# Patient Record
Sex: Male | Born: 1985 | Race: White | Hispanic: No | Marital: Single | Smoking: Current every day smoker
Health system: Southern US, Community
[De-identification: ages and names within clinical notes are randomized; demographics above are authoritative.]

## PROBLEM LIST (undated history)

## (undated) DIAGNOSIS — X838XXA Intentional self-harm by other specified means, initial encounter: Secondary | ICD-10-CM

## (undated) DIAGNOSIS — S069XAA Unspecified intracranial injury with loss of consciousness status unknown, initial encounter: Secondary | ICD-10-CM

## (undated) DIAGNOSIS — F329 Major depressive disorder, single episode, unspecified: Secondary | ICD-10-CM

## (undated) DIAGNOSIS — F4481 Dissociative identity disorder: Secondary | ICD-10-CM

## (undated) DIAGNOSIS — I10 Essential (primary) hypertension: Secondary | ICD-10-CM

## (undated) DIAGNOSIS — F419 Anxiety disorder, unspecified: Secondary | ICD-10-CM

## (undated) DIAGNOSIS — G629 Polyneuropathy, unspecified: Secondary | ICD-10-CM

## (undated) DIAGNOSIS — E119 Type 2 diabetes mellitus without complications: Secondary | ICD-10-CM

## (undated) DIAGNOSIS — S069X9A Unspecified intracranial injury with loss of consciousness of unspecified duration, initial encounter: Secondary | ICD-10-CM

## (undated) DIAGNOSIS — F32A Depression, unspecified: Secondary | ICD-10-CM

## (undated) HISTORY — PX: NO PAST SURGERIES: SHX2092

---
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
## 2009-06-18 ENCOUNTER — Emergency Department (HOSPITAL_COMMUNITY): Admission: EM | Admit: 2009-06-18 | Discharge: 2009-06-18 | Payer: Self-pay | Admitting: Emergency Medicine

## 2012-12-14 DIAGNOSIS — F4481 Dissociative identity disorder: Secondary | ICD-10-CM

## 2012-12-14 HISTORY — DX: Dissociative identity disorder: F44.81

## 2013-11-04 ENCOUNTER — Emergency Department (HOSPITAL_COMMUNITY)
Admission: EM | Admit: 2013-11-04 | Discharge: 2013-11-04 | Disposition: A | Discharge status: Home / Self Care | Payer: Self-pay | Attending: Emergency Medicine | Admitting: Emergency Medicine

## 2013-11-04 ENCOUNTER — Encounter (HOSPITAL_COMMUNITY): Payer: Self-pay | Admitting: Emergency Medicine

## 2013-11-04 ENCOUNTER — Emergency Department (HOSPITAL_COMMUNITY): Payer: Self-pay

## 2013-11-04 DIAGNOSIS — Z8659 Personal history of other mental and behavioral disorders: Secondary | ICD-10-CM | POA: Insufficient documentation

## 2013-11-04 DIAGNOSIS — S46909A Unspecified injury of unspecified muscle, fascia and tendon at shoulder and upper arm level, unspecified arm, initial encounter: Secondary | ICD-10-CM | POA: Insufficient documentation

## 2013-11-04 DIAGNOSIS — M25512 Pain in left shoulder: Secondary | ICD-10-CM

## 2013-11-04 DIAGNOSIS — S4980XA Other specified injuries of shoulder and upper arm, unspecified arm, initial encounter: Secondary | ICD-10-CM | POA: Insufficient documentation

## 2013-11-04 DIAGNOSIS — M791 Myalgia, unspecified site: Secondary | ICD-10-CM

## 2013-11-04 DIAGNOSIS — IMO0002 Reserved for concepts with insufficient information to code with codable children: Secondary | ICD-10-CM | POA: Insufficient documentation

## 2013-11-04 DIAGNOSIS — F17200 Nicotine dependence, unspecified, uncomplicated: Secondary | ICD-10-CM | POA: Insufficient documentation

## 2013-11-04 DIAGNOSIS — S0993XA Unspecified injury of face, initial encounter: Secondary | ICD-10-CM | POA: Insufficient documentation

## 2013-11-04 DIAGNOSIS — M542 Cervicalgia: Secondary | ICD-10-CM

## 2013-11-04 HISTORY — DX: Depression, unspecified: F32.A

## 2013-11-04 HISTORY — DX: Intentional self-harm by other specified means, initial encounter: X83.8XXA

## 2013-11-04 HISTORY — DX: Major depressive disorder, single episode, unspecified: F32.9

## 2013-11-04 MED ORDER — METHOCARBAMOL 500 MG PO TABS
500.0000 mg | ORAL_TABLET | Freq: Once | ORAL | Status: AC
  Administered 2013-11-04: 500 mg via ORAL
  Filled 2013-11-04: qty 1

## 2013-11-04 MED ORDER — CYCLOBENZAPRINE HCL 10 MG PO TABS: 10.0000 mg | ORAL_TABLET | Freq: Two times a day (BID) | ORAL | Status: DC | PRN
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
## 2013-11-04 NOTE — ED Notes (Signed)
States he is left handed. States has been  Taking ibuprofen for pain without relief. States cannot tolerate the pain anymore

## 2013-11-07 NOTE — ED Provider Notes (Signed)
Medical screening examination/treatment/procedure(s) were performed by non-physician practitioner and as supervising physician I was immediately available for consultation/collaboration.  EKG Interpretation   None        Donnetta Hutching, MD 11/07/13 [phone]

## 2014-03-07 ENCOUNTER — Emergency Department (HOSPITAL_COMMUNITY): Payer: Self-pay

## 2014-03-07 ENCOUNTER — Inpatient Hospital Stay (HOSPITAL_COMMUNITY)
Admission: EM | Admit: 2014-03-07 | Discharge: 2014-03-13 | DRG: 964 | Disposition: A | Discharge status: Home Health | Payer: Self-pay | Attending: Surgery | Admitting: Surgery

## 2014-03-07 DIAGNOSIS — S06339A Contusion and laceration of cerebrum, unspecified, with loss of consciousness of unspecified duration, initial encounter: Secondary | ICD-10-CM

## 2014-03-07 DIAGNOSIS — S32009A Unspecified fracture of unspecified lumbar vertebra, initial encounter for closed fracture: Secondary | ICD-10-CM | POA: Diagnosis present

## 2014-03-07 DIAGNOSIS — S0180XA Unspecified open wound of other part of head, initial encounter: Secondary | ICD-10-CM | POA: Diagnosis present

## 2014-03-07 DIAGNOSIS — M25469 Effusion, unspecified knee: Secondary | ICD-10-CM | POA: Diagnosis present

## 2014-03-07 DIAGNOSIS — IMO0002 Reserved for concepts with insufficient information to code with codable children: Secondary | ICD-10-CM

## 2014-03-07 DIAGNOSIS — S06300A Unspecified focal traumatic brain injury without loss of consciousness, initial encounter: Principal | ICD-10-CM

## 2014-03-07 DIAGNOSIS — S02401A Maxillary fracture, unspecified, initial encounter for closed fracture: Secondary | ICD-10-CM | POA: Diagnosis present

## 2014-03-07 DIAGNOSIS — S36899A Unspecified injury of other intra-abdominal organs, initial encounter: Secondary | ICD-10-CM

## 2014-03-07 DIAGNOSIS — S069XAA Unspecified intracranial injury with loss of consciousness status unknown, initial encounter: Secondary | ICD-10-CM

## 2014-03-07 DIAGNOSIS — S02400A Malar fracture unspecified, initial encounter for closed fracture: Secondary | ICD-10-CM | POA: Diagnosis present

## 2014-03-07 DIAGNOSIS — H532 Diplopia: Secondary | ICD-10-CM | POA: Diagnosis present

## 2014-03-07 DIAGNOSIS — R404 Transient alteration of awareness: Secondary | ICD-10-CM | POA: Diagnosis present

## 2014-03-07 DIAGNOSIS — D62 Acute posthemorrhagic anemia: Secondary | ICD-10-CM | POA: Diagnosis not present

## 2014-03-07 DIAGNOSIS — S0240CA Maxillary fracture, right side, initial encounter for closed fracture: Secondary | ICD-10-CM

## 2014-03-07 DIAGNOSIS — S0083XA Contusion of other part of head, initial encounter: Secondary | ICD-10-CM

## 2014-03-07 DIAGNOSIS — S0636AA Traumatic hemorrhage of cerebrum, unspecified, with loss of consciousness status unknown, initial encounter: Secondary | ICD-10-CM | POA: Diagnosis present

## 2014-03-07 DIAGNOSIS — S0181XA Laceration without foreign body of other part of head, initial encounter: Secondary | ICD-10-CM | POA: Diagnosis present

## 2014-03-07 DIAGNOSIS — T07XXXA Unspecified multiple injuries, initial encounter: Secondary | ICD-10-CM | POA: Diagnosis present

## 2014-03-07 DIAGNOSIS — S0292XA Unspecified fracture of facial bones, initial encounter for closed fracture: Secondary | ICD-10-CM | POA: Diagnosis present

## 2014-03-07 DIAGNOSIS — S0633AA Contusion and laceration of cerebrum, unspecified, with loss of consciousness status unknown, initial encounter: Secondary | ICD-10-CM

## 2014-03-07 DIAGNOSIS — S020XXA Fracture of vault of skull, initial encounter for closed fracture: Principal | ICD-10-CM | POA: Diagnosis present

## 2014-03-07 DIAGNOSIS — S069X9A Unspecified intracranial injury with loss of consciousness of unspecified duration, initial encounter: Secondary | ICD-10-CM

## 2014-03-07 DIAGNOSIS — K661 Hemoperitoneum: Secondary | ICD-10-CM | POA: Diagnosis present

## 2014-03-07 DIAGNOSIS — F17200 Nicotine dependence, unspecified, uncomplicated: Secondary | ICD-10-CM | POA: Diagnosis present

## 2014-03-07 DIAGNOSIS — S06369A Traumatic hemorrhage of cerebrum, unspecified, with loss of consciousness of unspecified duration, initial encounter: Secondary | ICD-10-CM | POA: Diagnosis present

## 2014-03-07 DIAGNOSIS — S27329A Contusion of lung, unspecified, initial encounter: Secondary | ICD-10-CM | POA: Diagnosis present

## 2014-03-07 DIAGNOSIS — H0520 Unspecified exophthalmos: Secondary | ICD-10-CM | POA: Diagnosis present

## 2014-03-07 DIAGNOSIS — S1093XA Contusion of unspecified part of neck, initial encounter: Secondary | ICD-10-CM

## 2014-03-07 DIAGNOSIS — S0003XA Contusion of scalp, initial encounter: Secondary | ICD-10-CM | POA: Diagnosis present

## 2014-03-07 DIAGNOSIS — K683 Retroperitoneal hematoma: Secondary | ICD-10-CM | POA: Diagnosis present

## 2014-03-07 DIAGNOSIS — S0291XA Unspecified fracture of skull, initial encounter for closed fracture: Secondary | ICD-10-CM

## 2014-03-07 HISTORY — DX: Polyneuropathy, unspecified: G62.9

## 2014-03-07 HISTORY — DX: Anxiety disorder, unspecified: F41.9

## 2014-03-07 HISTORY — DX: Dissociative identity disorder: F44.81

## 2014-03-07 LAB — ABO/RH: ABO/RH(D): A POS

## 2014-03-07 LAB — I-STAT CHEM 8, ED
BUN: 14 mg/dL (ref 6–23)
Calcium, Ion: 1.09 mmol/L — ABNORMAL LOW (ref 1.12–1.23)
Chloride: 105 mEq/L (ref 96–112)
Creatinine, Ser: 1.2 mg/dL (ref 0.50–1.35)
Glucose, Bld: 165 mg/dL — ABNORMAL HIGH (ref 70–99)
HCT: 44 % (ref 39.0–52.0)
Hemoglobin: 15 g/dL (ref 13.0–17.0)
Potassium: 3.6 mEq/L — ABNORMAL LOW (ref 3.7–5.3)
Sodium: 141 mEq/L (ref 137–147)
TCO2: 21 mmol/L (ref 0–100)

## 2014-03-07 LAB — COMPREHENSIVE METABOLIC PANEL
ALK PHOS: 59 U/L (ref 39–117)
ALT: 131 U/L — AB (ref 0–53)
AST: 452 U/L — ABNORMAL HIGH (ref 0–37)
Albumin: 3.2 g/dL — ABNORMAL LOW (ref 3.5–5.2)
BUN: 14 mg/dL (ref 6–23)
CHLORIDE: 102 meq/L (ref 96–112)
CO2: 20 mEq/L (ref 19–32)
Calcium: 8.4 mg/dL (ref 8.4–10.5)
Creatinine, Ser: 0.8 mg/dL (ref 0.50–1.35)
GFR calc Af Amer: >90 mL/min (ref >90)
GFR calc non Af Amer: >90 mL/min (ref >90)
GLUCOSE: 170 mg/dL — AB (ref 70–99)
POTASSIUM: 4 meq/L (ref 3.7–5.3)
SODIUM: 140 meq/L (ref 137–147)
TOTAL PROTEIN: 6.5 g/dL (ref 6.0–8.3)
Total Bilirubin: <0.2 mg/dL — ABNORMAL LOW (ref 0.3–1.2)

## 2014-03-07 LAB — PREPARE FRESH FROZEN PLASMA
UNIT DIVISION: 0
Unit division: 0

## 2014-03-07 LAB — ETHANOL: Alcohol, Ethyl (B): 237 mg/dL — ABNORMAL HIGH (ref 0–11)

## 2014-03-07 LAB — TYPE AND SCREEN
ABO/RH(D): A POS
Antibody Screen: NEGATIVE
Unit division: 0
Unit division: 0

## 2014-03-07 LAB — CBC
HCT: 39.4 % (ref 39.0–52.0)
Hemoglobin: 14.4 g/dL (ref 13.0–17.0)
MCH: 34.6 pg — ABNORMAL HIGH (ref 26.0–34.0)
MCHC: 36.5 g/dL — ABNORMAL HIGH (ref 30.0–36.0)
MCV: 94.7 fL (ref 78.0–100.0)
Platelets: 197 10*3/uL (ref 150–400)
RBC: 4.16 MIL/uL — ABNORMAL LOW (ref 4.22–5.81)
RDW: 12.8 % (ref 11.5–15.5)
WBC: 11.7 10*3/uL — ABNORMAL HIGH (ref 4.0–10.5)

## 2014-03-07 LAB — PROTIME-INR
INR: 1.03 (ref 0.00–1.49)
Prothrombin Time: 13.3 seconds (ref 11.6–15.2)

## 2014-03-07 LAB — LACTIC ACID, PLASMA: Lactic Acid, Venous: 3.8 mmol/L — ABNORMAL HIGH (ref 0.5–2.2)

## 2014-03-07 LAB — CDS SEROLOGY

## 2014-03-07 LAB — I-STAT CG4 LACTIC ACID, ED: LACTIC ACID, VENOUS: 3.55 mmol/L — AB (ref 0.5–2.2)

## 2014-03-07 MED ORDER — HYDROMORPHONE HCL PF 1 MG/ML IJ SOLN
1.0000 mg | Freq: Once | INTRAMUSCULAR | Status: AC
  Administered 2014-03-07: 1 mg via INTRAVENOUS
  Filled 2014-03-07: qty 1

## 2014-03-07 MED ORDER — FENTANYL CITRATE 0.05 MG/ML IJ SOLN
INTRAMUSCULAR | Status: AC
  Filled 2014-03-07: qty 2

## 2014-03-07 MED ORDER — FENTANYL CITRATE 0.05 MG/ML IJ SOLN
50.0000 ug | Freq: Once | INTRAMUSCULAR | Status: AC
  Administered 2014-03-07: 50 ug via INTRAVENOUS

## 2014-03-07 MED ORDER — SODIUM CHLORIDE 0.9 % IV BOLUS (SEPSIS)
1000.0000 mL | Freq: Once | INTRAVENOUS | Status: AC
  Administered 2014-03-07: 1000 mL via INTRAVENOUS

## 2014-03-07 MED ORDER — TETANUS-DIPHTH-ACELL PERTUSSIS 5-2.5-18.5 LF-MCG/0.5 IM SUSP
0.5000 mL | Freq: Once | INTRAMUSCULAR | Status: AC
  Administered 2014-03-07: 0.5 mL via INTRAMUSCULAR
  Filled 2014-03-07: qty 0.5

## 2014-03-07 MED ORDER — IOHEXOL 300 MG/ML  SOLN
100.0000 mL | Freq: Once | INTRAMUSCULAR | Status: AC | PRN
  Administered 2014-03-07: 100 mL via INTRAVENOUS
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
## 2014-03-07 NOTE — ED Notes (Signed)
See trauma documentation for triage.
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
## 2014-03-08 ENCOUNTER — Inpatient Hospital Stay (HOSPITAL_COMMUNITY): Payer: Self-pay

## 2014-03-08 ENCOUNTER — Encounter (HOSPITAL_COMMUNITY): Payer: Self-pay | Admitting: Emergency Medicine

## 2014-03-08 DIAGNOSIS — S069X9A Unspecified intracranial injury with loss of consciousness of unspecified duration, initial encounter: Secondary | ICD-10-CM

## 2014-03-08 DIAGNOSIS — S069XAA Unspecified intracranial injury with loss of consciousness status unknown, initial encounter: Secondary | ICD-10-CM

## 2014-03-08 LAB — CBC
HCT: 36.1 % — ABNORMAL LOW (ref 39.0–52.0)
Hemoglobin: 12.9 g/dL — ABNORMAL LOW (ref 13.0–17.0)
MCH: 33.9 pg (ref 26.0–34.0)
MCHC: 35.7 g/dL (ref 30.0–36.0)
MCV: 94.8 fL (ref 78.0–100.0)
Platelets: 169 10*3/uL (ref 150–400)
RBC: 3.81 MIL/uL — ABNORMAL LOW (ref 4.22–5.81)
RDW: 12.9 % (ref 11.5–15.5)
WBC: 13.4 10*3/uL — ABNORMAL HIGH (ref 4.0–10.5)

## 2014-03-08 LAB — BASIC METABOLIC PANEL
BUN: 8 mg/dL (ref 6–23)
CO2: 19 mEq/L (ref 19–32)
Calcium: 8.1 mg/dL — ABNORMAL LOW (ref 8.4–10.5)
Chloride: 105 mEq/L (ref 96–112)
Creatinine, Ser: 0.58 mg/dL (ref 0.50–1.35)
GFR calc Af Amer: >90 mL/min (ref >90)
Glucose, Bld: 169 mg/dL — ABNORMAL HIGH (ref 70–99)
POTASSIUM: 3.8 meq/L (ref 3.7–5.3)
SODIUM: 140 meq/L (ref 137–147)

## 2014-03-08 MED ORDER — OXYCODONE-ACETAMINOPHEN 5-325 MG PO TABS
1.0000 | ORAL_TABLET | ORAL | Status: DC | PRN
  Administered 2014-03-08: 1 via ORAL
  Administered 2014-03-08 (×2): 2 via ORAL
  Administered 2014-03-08: 1 via ORAL
  Administered 2014-03-09 – 2014-03-11 (×10): 2 via ORAL
  Filled 2014-03-08 (×6): qty 2
  Filled 2014-03-08: qty 1
  Filled 2014-03-08: qty 2
  Filled 2014-03-08: qty 1
  Filled 2014-03-08 (×5): qty 2

## 2014-03-08 MED ORDER — LORAZEPAM 2 MG/ML IJ SOLN
1.0000 mg | Freq: Once | INTRAMUSCULAR | Status: AC
  Administered 2014-03-08: 1 mg via INTRAVENOUS
  Filled 2014-03-08: qty 1

## 2014-03-08 MED ORDER — HYDROMORPHONE HCL PF 1 MG/ML IJ SOLN
1.0000 mg | INTRAMUSCULAR | Status: DC | PRN
  Administered 2014-03-08 – 2014-03-09 (×8): 1 mg via INTRAVENOUS
  Filled 2014-03-08 (×8): qty 1

## 2014-03-08 MED ORDER — ONDANSETRON HCL 4 MG/2ML IJ SOLN
4.0000 mg | Freq: Four times a day (QID) | INTRAMUSCULAR | Status: DC | PRN
  Administered 2014-03-08 – 2014-03-09 (×2): 4 mg via INTRAVENOUS
  Filled 2014-03-08 (×2): qty 2

## 2014-03-08 MED ORDER — ACETAMINOPHEN 325 MG PO TABS
650.0000 mg | ORAL_TABLET | ORAL | Status: DC | PRN
  Administered 2014-03-11 (×2): 325 mg via ORAL
  Administered 2014-03-12: 650 mg via ORAL
  Filled 2014-03-08 (×3): qty 2

## 2014-03-08 MED ORDER — ONDANSETRON HCL 4 MG PO TABS: 4.0000 mg | ORAL_TABLET | Freq: Four times a day (QID) | ORAL | Status: DC | PRN

## 2014-03-08 MED ORDER — BACITRACIN ZINC 500 UNIT/GM EX OINT
TOPICAL_OINTMENT | Freq: Every day | CUTANEOUS | Status: DC
  Administered 2014-03-08 – 2014-03-09 (×2): via TOPICAL
  Filled 2014-03-08: qty 28.35

## 2014-03-08 MED ORDER — CEPHALEXIN 500 MG PO CAPS
500.0000 mg | ORAL_CAPSULE | Freq: Four times a day (QID) | ORAL | Status: DC
  Filled 2014-03-08: qty 1

## 2014-03-08 MED ORDER — SODIUM CHLORIDE 0.9 % IV SOLN
INTRAVENOUS | Status: DC
  Administered 2014-03-08 – 2014-03-10 (×3): via INTRAVENOUS

## 2014-03-08 MED ORDER — CEFAZOLIN SODIUM 1-5 GM-% IV SOLN
1.0000 g | Freq: Three times a day (TID) | INTRAVENOUS | Status: DC
  Administered 2014-03-08 – 2014-03-11 (×10): 1 g via INTRAVENOUS
  Filled 2014-03-08 (×14): qty 50

## 2014-03-08 NOTE — Progress Notes (Signed)
UR completed.  Hazeline Charnley, RN BSN [facility] CCM Trauma/Neuro ICU Case Manager [phone]  
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
## 2014-03-08 NOTE — Consult Note (Signed)
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 contusions. Skull fractures need no operative intervention. Will follow. Remains in cervical collar. No abnormalities on ct cervical spine.   Randall Burnett 03/08/2014, 6:10 PM
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
## 2014-03-09 ENCOUNTER — Encounter (HOSPITAL_COMMUNITY): Payer: Self-pay | Admitting: Physical Medicine and Rehabilitation

## 2014-03-09 ENCOUNTER — Inpatient Hospital Stay (HOSPITAL_COMMUNITY): Payer: Self-pay

## 2014-03-09 DIAGNOSIS — K683 Retroperitoneal hematoma: Secondary | ICD-10-CM | POA: Diagnosis present

## 2014-03-09 DIAGNOSIS — S0292XA Unspecified fracture of facial bones, initial encounter for closed fracture: Secondary | ICD-10-CM | POA: Diagnosis present

## 2014-03-09 DIAGNOSIS — S06369A Traumatic hemorrhage of cerebrum, unspecified, with loss of consciousness of unspecified duration, initial encounter: Secondary | ICD-10-CM | POA: Diagnosis present

## 2014-03-09 DIAGNOSIS — S0181XA Laceration without foreign body of other part of head, initial encounter: Secondary | ICD-10-CM | POA: Diagnosis present

## 2014-03-09 DIAGNOSIS — S0636AA Traumatic hemorrhage of cerebrum, unspecified, with loss of consciousness status unknown, initial encounter: Secondary | ICD-10-CM | POA: Diagnosis present

## 2014-03-09 DIAGNOSIS — S069XAA Unspecified intracranial injury with loss of consciousness status unknown, initial encounter: Secondary | ICD-10-CM

## 2014-03-09 DIAGNOSIS — S069X9A Unspecified intracranial injury with loss of consciousness of unspecified duration, initial encounter: Secondary | ICD-10-CM

## 2014-03-09 DIAGNOSIS — T07XXXA Unspecified multiple injuries, initial encounter: Secondary | ICD-10-CM | POA: Diagnosis present

## 2014-03-09 DIAGNOSIS — D62 Acute posthemorrhagic anemia: Secondary | ICD-10-CM | POA: Diagnosis not present

## 2014-03-09 DIAGNOSIS — K661 Hemoperitoneum: Secondary | ICD-10-CM | POA: Diagnosis present

## 2014-03-09 MED ORDER — BACITRACIN ZINC 500 UNIT/GM EX OINT
TOPICAL_OINTMENT | Freq: Two times a day (BID) | CUTANEOUS | Status: DC
  Administered 2014-03-09 – 2014-03-10 (×2): via TOPICAL
  Administered 2014-03-11: 1 via TOPICAL
  Administered 2014-03-11: 10:00:00 via TOPICAL
  Administered 2014-03-12: 1 via TOPICAL
  Administered 2014-03-12: 15.5556 via TOPICAL
  Administered 2014-03-13: 08:00:00 via TOPICAL
  Filled 2014-03-09: qty 28.35

## 2014-03-09 MED ORDER — QUETIAPINE FUMARATE 50 MG PO TABS
150.0000 mg | ORAL_TABLET | Freq: Every day | ORAL | Status: DC
  Administered 2014-03-09 – 2014-03-12 (×4): 150 mg via ORAL
  Filled 2014-03-09 (×5): qty 1

## 2014-03-09 MED ORDER — LORAZEPAM 2 MG/ML IJ SOLN
1.0000 mg | INTRAMUSCULAR | Status: DC | PRN
  Administered 2014-03-09: 1 mg via INTRAVENOUS
  Filled 2014-03-09: qty 1

## 2014-03-09 MED ORDER — BACITRACIN 500 UNIT/GM EX OINT
1.0000 "application " | TOPICAL_OINTMENT | Freq: Two times a day (BID) | CUTANEOUS | Status: DC
  Filled 2014-03-09: qty 0.9

## 2014-03-09 MED ORDER — BACITRACIN 500 UNIT/GM EX OINT
1.0000 "application " | TOPICAL_OINTMENT | Freq: Two times a day (BID) | CUTANEOUS | Status: DC
  Administered 2014-03-09: 1 via TOPICAL
  Filled 2014-03-09 (×2): qty 0.9
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
## 2014-03-09 NOTE — Progress Notes (Signed)
03/09/2014 11:29 AM  Wardell Heathountryman, [place] [id number]  Post-Op Day 2    Temp:  [98 F (36.7 C)-99.3 F (37.4 C)] 99.3 F (37.4 C) (03/27 0800) Pulse Rate:  [97-120] 98 (03/27 1000) Resp:  [15-25] 17 (03/27 1000) BP: (119-158)/(72-108) 138/72 mmHg (03/27 0800) SpO2:  [95 %-99 %] 96 % (03/27 1000),     Intake/Output Summary (Last 24 hours) at 03/09/14 1129 Last data filed at 03/09/14 1100  Gross per 24 hour  Intake   1350 ml  Output    725 ml  Net    625 ml    No results found for this or any previous visit (from the past 24 hour(s)).  SUBJECTIVE:  multiple areas of pain  OBJECTIVE:  Facial wounds intact.    IMPRESSION:  Satisfactory check  PLAN:   Wound hygiene.  Recheck my office 8-10 days, [phone] for appointment.  Flo ShanksWOLICKI, Dmiyah Liscano
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
## 2014-03-09 NOTE — Progress Notes (Signed)
Patient ID: Randall JunesMathew J Burnett, male   DOB: 06/21/1986, 28 y.o.   MRN: [id number] BP 156/100  Pulse 89  Temp(Src) 98.5 F (36.9 C) (Oral)  Resp 16  Ht 5\' 8"  (1.727 m)  Wt 90.719 kg (200 lb)  BMI 30.42 kg/m2  SpO2 98% Alert and oriented x 4 Speech is clear and fluent perrl full eom Tongue and uvula midline Moving all extremities well Will order brace for lumbar
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
## 2014-03-09 NOTE — Progress Notes (Signed)
Rehab Admissions Coordinator Note:  Patient was screened by Clois DupesBoyette, Winry Egnew Godwin for appropriateness for an Inpatient Acute Rehab Consult per PT recs. At this time, we are recommending Inpatient Rehab consult per Dr. Lindie SpruceWyatt request.  Clois DupesBoyette, Shanell Aden Godwin 03/09/2014, 10:25 AM  I can be reached at [phone].

## 2014-03-09 NOTE — Progress Notes (Signed)
Patient trying to get out of bed. He is continuously taking off his leads & Aspen collar. Patient states, "I am having an anxiety attack. I want to leave this place." MD notified. Randall Burnett, [name] D

## 2014-03-10 LAB — BASIC METABOLIC PANEL
BUN: 5 mg/dL — AB (ref 6–23)
CALCIUM: 8.6 mg/dL (ref 8.4–10.5)
CO2: 20 mEq/L (ref 19–32)
CREATININE: 0.56 mg/dL (ref 0.50–1.35)
Chloride: 104 mEq/L (ref 96–112)
GFR calc Af Amer: >90 mL/min (ref >90)
GLUCOSE: 103 mg/dL — AB (ref 70–99)
Potassium: 3.5 mEq/L — ABNORMAL LOW (ref 3.7–5.3)
Sodium: 139 mEq/L (ref 137–147)

## 2014-03-10 LAB — CBC WITH DIFFERENTIAL/PLATELET
BASOS PCT: 0 % (ref 0–1)
Basophils Absolute: 0 10*3/uL (ref 0.0–0.1)
EOS ABS: 0.1 10*3/uL (ref 0.0–0.7)
EOS PCT: 1 % (ref 0–5)
HCT: 28.6 % — ABNORMAL LOW (ref 39.0–52.0)
Hemoglobin: 10.3 g/dL — ABNORMAL LOW (ref 13.0–17.0)
Lymphocytes Relative: 30 % (ref 12–46)
Lymphs Abs: 2.4 10*3/uL (ref 0.7–4.0)
MCH: 33.8 pg (ref 26.0–34.0)
MCHC: 36 g/dL (ref 30.0–36.0)
MCV: 93.8 fL (ref 78.0–100.0)
MONOS PCT: 8 % (ref 3–12)
Monocytes Absolute: 0.7 10*3/uL (ref 0.1–1.0)
Neutro Abs: 5 10*3/uL (ref 1.7–7.7)
Neutrophils Relative %: 61 % (ref 43–77)
Platelets: 132 10*3/uL — ABNORMAL LOW (ref 150–400)
RBC: 3.05 MIL/uL — ABNORMAL LOW (ref 4.22–5.81)
RDW: 12.7 % (ref 11.5–15.5)
WBC: 8.2 10*3/uL (ref 4.0–10.5)
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
## 2014-03-10 NOTE — Progress Notes (Signed)
Orthopedic Tech Progress Note Patient Details:  Randall Burnett 29-Mar-1986 [id number]  Patient ID: Randall Burnett, male   DOB: 29-Mar-1986, 28 y.o.   MRN: [id number] Brace order completed by Storm Friskbio-tech  Randall Burnett 03/10/2014, 2:20 PM

## 2014-03-10 NOTE — Progress Notes (Signed)
Orthopedic Tech Progress Note Patient Details:  Randall JunesMathew J Burnett Apr 22, 1986 [id number]  Patient ID: Randall Burnett, male   DOB: Apr 22, 1986, [age] y.o.   MRN: [id number] Brace order completed by Storm Friskbio-tech  Shilee Biggs 03/10/2014, 2:17 PM
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
## 2014-03-11 DIAGNOSIS — D62 Acute posthemorrhagic anemia: Secondary | ICD-10-CM

## 2014-03-11 MED ORDER — OXYCODONE HCL 5 MG PO TABS
5.0000 mg | ORAL_TABLET | ORAL | Status: DC | PRN
  Administered 2014-03-11 (×2): 5 mg via ORAL
  Administered 2014-03-11: 10 mg via ORAL
  Administered 2014-03-11: 5 mg via ORAL
  Administered 2014-03-12: 15 mg via ORAL
  Administered 2014-03-12 – 2014-03-13 (×6): 10 mg via ORAL
  Filled 2014-03-11 (×2): qty 2
  Filled 2014-03-11 (×2): qty 1
  Filled 2014-03-11 (×2): qty 2
  Filled 2014-03-11: qty 1
  Filled 2014-03-11 (×2): qty 2
  Filled 2014-03-11: qty 3
  Filled 2014-03-11: qty 2

## 2014-03-11 MED ORDER — HYDROMORPHONE HCL PF 1 MG/ML IJ SOLN
0.5000 mg | INTRAMUSCULAR | Status: DC | PRN
  Administered 2014-03-12: 0.5 mg via INTRAVENOUS
  Administered 2014-03-12: 15:00:00 via INTRAVENOUS
  Filled 2014-03-11 (×2): qty 1

## 2014-03-11 MED ORDER — QUETIAPINE FUMARATE 50 MG PO TABS: 150.0000 mg | ORAL_TABLET | Freq: Every day | ORAL | Status: DC

## 2014-03-11 MED ORDER — CEPHALEXIN 250 MG PO CAPS
250.0000 mg | ORAL_CAPSULE | Freq: Four times a day (QID) | ORAL | Status: DC
  Administered 2014-03-11 – 2014-03-13 (×9): 250 mg via ORAL
  Filled 2014-03-11 (×12): qty 1
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
## 2014-03-11 NOTE — Progress Notes (Signed)
Patient transported via W/C to 4N14 by Jennette Kettleeanna, RN. No family at bedside.

## 2014-03-12 LAB — CBC
HCT: 30.3 % — ABNORMAL LOW (ref 39.0–52.0)
Hemoglobin: 10.9 g/dL — ABNORMAL LOW (ref 13.0–17.0)
MCH: 34 pg (ref 26.0–34.0)
MCHC: 36 g/dL (ref 30.0–36.0)
MCV: 94.4 fL (ref 78.0–100.0)
PLATELETS: 228 10*3/uL (ref 150–400)
RBC: 3.21 MIL/uL — ABNORMAL LOW (ref 4.22–5.81)
RDW: 13.3 % (ref 11.5–15.5)
WBC: 8.2 10*3/uL (ref 4.0–10.5)

## 2014-03-12 NOTE — Progress Notes (Signed)
Patient ID: Randall JunesMathew J Burnett, male   DOB: 04-03-1986, 28 y.o.   MRN: [id number] BP 154/76  Pulse 72  Temp(Src) 99 F (37.2 C) (Oral)  Resp 18  Ht 5\' 8"  (1.727 m)  Wt 90.719 kg (200 lb)  BMI 30.42 kg/m2  SpO2 100% alert and oriented x 4 Speech is clear, fluent Moving all extremities well Await placement

## 2014-03-12 NOTE — Progress Notes (Signed)
Rehab admissions - I met with patient and I spoke with therapists.  I called patient's uncle at [phone] and then asked for room 210.  The uncle tells me that he can manage patient at home.  Patient does not want to come to inpatient rehab.  He wants to go home and go to outpatient therapy.  Therapist would like uncle to come in for some education prior to sending patient back home.  Patient is moving around well today.  Call me for questions.  #[phone]

## 2014-03-12 NOTE — Progress Notes (Signed)
Rehab admissions - Evaluated for possible admission.  Patient asleep this am when I rounded.  I called patient's brother, but no answer.  I called patient's uncle.  The phone for the uncle is actually the patient's voice, but I did leave a message.  I will check back with patient shortly.  Call me for questions.  #[phone]
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
## 2014-03-12 NOTE — Progress Notes (Signed)
Had a BM. Pain control better. CIR is evaluating. Patient examined and I agree with the assessment and plan  Violeta GelinasBurke Lavanda Nevels, MD, MPH, FACS Trauma: [phone] General Surgery: [phone]  03/12/2014 12:44 PM

## 2014-03-13 MED ORDER — ACETAMINOPHEN 325 MG PO TABS: 650.0000 mg | ORAL_TABLET | ORAL | Status: DC | PRN

## 2014-03-13 MED ORDER — CEPHALEXIN 250 MG PO CAPS
250.0000 mg | ORAL_CAPSULE | Freq: Four times a day (QID) | ORAL | Status: AC
Start: 2014-03-13 — End: 2014-03-20

## 2014-03-13 MED ORDER — OXYCODONE HCL 5 MG PO TABS: 5.0000 mg | ORAL_TABLET | ORAL | Status: DC | PRN

## 2014-03-13 NOTE — Progress Notes (Signed)
Speech Language Pathology  Patient Details Name: Randall Burnett MRN: [id number] DOB: 1986/04/20 Today's Date: 03/13/2014 Time:  -       Recommend home health ST at discharge.  Breck CoonsLisa Willis CardwellLitaker M.Ed [employer] IndustriesCCC-SLP Pager [phone]  03/13/2014

## 2014-03-13 NOTE — Progress Notes (Signed)
Discharge instructions given. Pt verbalized understanding and all questions were answered.  
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
## 2014-03-13 NOTE — Clinical Social Work Note (Signed)
Clinical Social Worker continuing to follow patient for support and discharge planning needs.  Per PT/OT recommendations, patient is able to return to the hotel with his uncle.  PA has discharged and patient uncle plans to take patient home.  CM will arrange home health as needed prior to discharge.  Clinical Social Worker will sign off for now as social work intervention is no longer needed. Please consult us again if new need arises.  Macario GoldsJesse Azaylia Fong, [place] [phone]

## 2014-03-13 NOTE — Discharge Summary (Signed)
Stable for placement

## 2014-03-13 NOTE — Discharge Instructions (Signed)
Do not take any goody powders, aspirin, ibuprofen, motrin  Do not drive  No strenuous activity

## 2014-03-14 ENCOUNTER — Encounter (HOSPITAL_COMMUNITY): Payer: Self-pay | Admitting: Emergency Medicine

## 2014-09-03 ENCOUNTER — Emergency Department (HOSPITAL_COMMUNITY): Payer: Self-pay

## 2014-09-03 ENCOUNTER — Inpatient Hospital Stay (HOSPITAL_COMMUNITY)
Admission: EM | Admit: 2014-09-03 | Discharge: 2014-09-05 | DRG: 101 | Discharge status: Against Medical Advice | Payer: Self-pay | Attending: Internal Medicine | Admitting: Internal Medicine

## 2014-09-03 ENCOUNTER — Encounter (HOSPITAL_COMMUNITY): Payer: Self-pay | Admitting: Emergency Medicine

## 2014-09-03 DIAGNOSIS — K661 Hemoperitoneum: Secondary | ICD-10-CM

## 2014-09-03 DIAGNOSIS — F329 Major depressive disorder, single episode, unspecified: Secondary | ICD-10-CM | POA: Diagnosis present

## 2014-09-03 DIAGNOSIS — F411 Generalized anxiety disorder: Secondary | ICD-10-CM | POA: Diagnosis present

## 2014-09-03 DIAGNOSIS — F1910 Other psychoactive substance abuse, uncomplicated: Secondary | ICD-10-CM | POA: Diagnosis present

## 2014-09-03 DIAGNOSIS — E119 Type 2 diabetes mellitus without complications: Secondary | ICD-10-CM | POA: Diagnosis present

## 2014-09-03 DIAGNOSIS — R55 Syncope and collapse: Secondary | ICD-10-CM

## 2014-09-03 DIAGNOSIS — R7989 Other specified abnormal findings of blood chemistry: Secondary | ICD-10-CM

## 2014-09-03 DIAGNOSIS — F3289 Other specified depressive episodes: Secondary | ICD-10-CM | POA: Diagnosis present

## 2014-09-03 DIAGNOSIS — G589 Mononeuropathy, unspecified: Secondary | ICD-10-CM | POA: Diagnosis present

## 2014-09-03 DIAGNOSIS — B1920 Unspecified viral hepatitis C without hepatic coma: Secondary | ICD-10-CM | POA: Diagnosis present

## 2014-09-03 DIAGNOSIS — R740 Nonspecific elevation of levels of transaminase and lactic acid dehydrogenase [LDH]: Secondary | ICD-10-CM

## 2014-09-03 DIAGNOSIS — E871 Hypo-osmolality and hyponatremia: Secondary | ICD-10-CM | POA: Diagnosis present

## 2014-09-03 DIAGNOSIS — R945 Abnormal results of liver function studies: Secondary | ICD-10-CM

## 2014-09-03 DIAGNOSIS — F1020 Alcohol dependence, uncomplicated: Secondary | ICD-10-CM | POA: Diagnosis present

## 2014-09-03 DIAGNOSIS — E860 Dehydration: Secondary | ICD-10-CM | POA: Diagnosis present

## 2014-09-03 DIAGNOSIS — D62 Acute posthemorrhagic anemia: Secondary | ICD-10-CM

## 2014-09-03 DIAGNOSIS — Z87820 Personal history of traumatic brain injury: Secondary | ICD-10-CM

## 2014-09-03 DIAGNOSIS — F17200 Nicotine dependence, unspecified, uncomplicated: Secondary | ICD-10-CM | POA: Diagnosis present

## 2014-09-03 DIAGNOSIS — R7401 Elevation of levels of liver transaminase levels: Secondary | ICD-10-CM | POA: Diagnosis present

## 2014-09-03 DIAGNOSIS — F1010 Alcohol abuse, uncomplicated: Secondary | ICD-10-CM | POA: Diagnosis present

## 2014-09-03 DIAGNOSIS — R569 Unspecified convulsions: Principal | ICD-10-CM | POA: Diagnosis present

## 2014-09-03 DIAGNOSIS — F4481 Dissociative identity disorder: Secondary | ICD-10-CM | POA: Diagnosis present

## 2014-09-03 DIAGNOSIS — T07XXXA Unspecified multiple injuries, initial encounter: Secondary | ICD-10-CM

## 2014-09-03 DIAGNOSIS — G40909 Epilepsy, unspecified, not intractable, without status epilepticus: Secondary | ICD-10-CM | POA: Diagnosis present

## 2014-09-03 DIAGNOSIS — Z79899 Other long term (current) drug therapy: Secondary | ICD-10-CM

## 2014-09-03 HISTORY — DX: Unspecified intracranial injury with loss of consciousness of unspecified duration, initial encounter: S06.9X9A

## 2014-09-03 HISTORY — DX: Unspecified intracranial injury with loss of consciousness status unknown, initial encounter: S06.9XAA

## 2014-09-03 LAB — COMPREHENSIVE METABOLIC PANEL
ALBUMIN: 3.7 g/dL (ref 3.5–5.2)
ALT: 918 U/L — ABNORMAL HIGH (ref 0–53)
ANION GAP: 18 — AB (ref 5–15)
AST: 735 U/L — AB (ref 0–37)
Alkaline Phosphatase: 142 U/L — ABNORMAL HIGH (ref 39–117)
BUN: 7 mg/dL (ref 6–23)
CALCIUM: 9 mg/dL (ref 8.4–10.5)
CO2: 21 mEq/L (ref 19–32)
CREATININE: 0.82 mg/dL (ref 0.50–1.35)
Chloride: 93 mEq/L — ABNORMAL LOW (ref 96–112)
GFR calc Af Amer: >90 mL/min (ref >90)
GFR calc non Af Amer: >90 mL/min (ref >90)
Glucose, Bld: 271 mg/dL — ABNORMAL HIGH (ref 70–99)
Potassium: 4.1 mEq/L (ref 3.7–5.3)
Sodium: 132 mEq/L — ABNORMAL LOW (ref 137–147)
Total Bilirubin: 0.9 mg/dL (ref 0.3–1.2)
Total Protein: 7.8 g/dL (ref 6.0–8.3)

## 2014-09-03 LAB — CBG MONITORING, ED: GLUCOSE-CAPILLARY: 274 mg/dL — AB (ref 70–99)

## 2014-09-03 LAB — CBC WITH DIFFERENTIAL/PLATELET
BASOS ABS: 0 10*3/uL (ref 0.0–0.1)
BASOS PCT: 1 % (ref 0–1)
EOS ABS: 0.1 10*3/uL (ref 0.0–0.7)
EOS PCT: 1 % (ref 0–5)
HCT: 51.9 % (ref 39.0–52.0)
HEMOGLOBIN: 18.2 g/dL — AB (ref 13.0–17.0)
Lymphocytes Relative: 37 % (ref 12–46)
Lymphs Abs: 2.1 10*3/uL (ref 0.7–4.0)
MCH: 35.5 pg — AB (ref 26.0–34.0)
MCHC: 35.1 g/dL (ref 30.0–36.0)
MCV: 101.4 fL — AB (ref 78.0–100.0)
Monocytes Absolute: 0.7 10*3/uL (ref 0.1–1.0)
Monocytes Relative: 11 % (ref 3–12)
Neutro Abs: 2.8 10*3/uL (ref 1.7–7.7)
Neutrophils Relative %: 50 % (ref 43–77)
PLATELETS: 198 10*3/uL (ref 150–400)
RBC: 5.12 MIL/uL (ref 4.22–5.81)
RDW: 12.3 % (ref 11.5–15.5)
WBC: 5.7 10*3/uL (ref 4.0–10.5)

## 2014-09-03 LAB — TROPONIN I

## 2014-09-03 LAB — ETHANOL: Alcohol, Ethyl (B): 208 mg/dL — ABNORMAL HIGH (ref 0–11)

## 2014-09-03 LAB — D-DIMER, QUANTITATIVE (NOT AT ARMC): D DIMER QUANT: 0.55 ug{FEU}/mL — AB (ref 0.00–0.48)

## 2014-09-03 MED ORDER — ALBUTEROL SULFATE HFA 108 (90 BASE) MCG/ACT IN AERS
2.0000 | INHALATION_SPRAY | Freq: Once | RESPIRATORY_TRACT | Status: AC
  Administered 2014-09-03: 2 via RESPIRATORY_TRACT
  Filled 2014-09-03: qty 6.7

## 2014-09-03 MED ORDER — LORAZEPAM 2 MG/ML IJ SOLN
1.0000 mg | Freq: Once | INTRAMUSCULAR | Status: AC
  Administered 2014-09-04: 1 mg via INTRAVENOUS
  Filled 2014-09-03: qty 1

## 2014-09-03 MED ORDER — SODIUM CHLORIDE 0.9 % IV BOLUS (SEPSIS)
1000.0000 mL | Freq: Once | INTRAVENOUS | Status: AC
  Administered 2014-09-04: 1000 mL via INTRAVENOUS

## 2014-09-03 MED ORDER — IOHEXOL 350 MG/ML SOLN
100.0000 mL | Freq: Once | INTRAVENOUS | Status: AC | PRN
  Administered 2014-09-03: 100 mL via INTRAVENOUS

## 2014-09-03 MED ORDER — SODIUM CHLORIDE 0.9 % IV BOLUS (SEPSIS)
1000.0000 mL | Freq: Once | INTRAVENOUS | Status: AC
  Administered 2014-09-03: 1000 mL via INTRAVENOUS

## 2014-09-03 NOTE — ED Notes (Signed)
Pt had seizure today. Only 2nd Seizure ever. Not sure if they are alcohol induced. Hx of TBI. Pt was sitting on toilet when this happened. Breathing 6/min/cyanotic when EMS arrived. Alert and oriented. Pt has had  1/3 of a fifth of whiskey today.

## 2014-09-03 NOTE — ED Notes (Signed)
Bed: [facility] Expected date:  Expected time:  Means of arrival:  Comments: ems- seizure
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
## 2014-09-03 NOTE — ED Notes (Signed)
Nurse was notified about CBG

## 2014-09-04 ENCOUNTER — Inpatient Hospital Stay (HOSPITAL_COMMUNITY)
Admission: EM | Admit: 2014-09-04 | Discharge: 2014-09-04 | Disposition: A | Discharge status: Home / Self Care | Payer: Self-pay | Source: Home / Self Care | Attending: Internal Medicine | Admitting: Internal Medicine

## 2014-09-04 ENCOUNTER — Inpatient Hospital Stay (HOSPITAL_COMMUNITY): Payer: Self-pay

## 2014-09-04 ENCOUNTER — Encounter (HOSPITAL_COMMUNITY): Payer: Self-pay | Admitting: Internal Medicine

## 2014-09-04 DIAGNOSIS — R55 Syncope and collapse: Secondary | ICD-10-CM

## 2014-09-04 DIAGNOSIS — G40909 Epilepsy, unspecified, not intractable, without status epilepticus: Secondary | ICD-10-CM

## 2014-09-04 DIAGNOSIS — E860 Dehydration: Secondary | ICD-10-CM

## 2014-09-04 DIAGNOSIS — E119 Type 2 diabetes mellitus without complications: Secondary | ICD-10-CM

## 2014-09-04 DIAGNOSIS — F1010 Alcohol abuse, uncomplicated: Secondary | ICD-10-CM

## 2014-09-04 DIAGNOSIS — R7401 Elevation of levels of liver transaminase levels: Secondary | ICD-10-CM | POA: Diagnosis present

## 2014-09-04 DIAGNOSIS — R740 Nonspecific elevation of levels of transaminase and lactic acid dehydrogenase [LDH]: Secondary | ICD-10-CM

## 2014-09-04 LAB — URINALYSIS, ROUTINE W REFLEX MICROSCOPIC
GLUCOSE, UA: 100 mg/dL — AB
Hgb urine dipstick: NEGATIVE
Ketones, ur: NEGATIVE mg/dL
LEUKOCYTES UA: NEGATIVE
NITRITE: NEGATIVE
PH: 5.5 (ref 5.0–8.0)
PROTEIN: NEGATIVE mg/dL
Specific Gravity, Urine: >1.046 — ABNORMAL HIGH (ref 1.005–1.030)
Urobilinogen, UA: 2 mg/dL — ABNORMAL HIGH (ref 0.0–1.0)

## 2014-09-04 LAB — CBC
HEMATOCRIT: 46.9 % (ref 39.0–52.0)
Hemoglobin: 16 g/dL (ref 13.0–17.0)
MCH: 35 pg — AB (ref 26.0–34.0)
MCHC: 34.1 g/dL (ref 30.0–36.0)
MCV: 102.6 fL — AB (ref 78.0–100.0)
Platelets: 185 10*3/uL (ref 150–400)
RBC: 4.57 MIL/uL (ref 4.22–5.81)
RDW: 12.3 % (ref 11.5–15.5)
WBC: 6.7 10*3/uL (ref 4.0–10.5)

## 2014-09-04 LAB — COMPREHENSIVE METABOLIC PANEL
ALT: 786 U/L — AB (ref 0–53)
AST: 661 U/L — AB (ref 0–37)
Albumin: 3.3 g/dL — ABNORMAL LOW (ref 3.5–5.2)
Alkaline Phosphatase: 112 U/L (ref 39–117)
Anion gap: 13 (ref 5–15)
BILIRUBIN TOTAL: 0.7 mg/dL (ref 0.3–1.2)
BUN: 6 mg/dL (ref 6–23)
CO2: 23 meq/L (ref 19–32)
Calcium: 8.9 mg/dL (ref 8.4–10.5)
Chloride: 102 mEq/L (ref 96–112)
Creatinine, Ser: 0.84 mg/dL (ref 0.50–1.35)
GFR calc Af Amer: >90 mL/min (ref >90)
GFR calc non Af Amer: >90 mL/min (ref >90)
Glucose, Bld: 154 mg/dL — ABNORMAL HIGH (ref 70–99)
Potassium: 4.1 mEq/L (ref 3.7–5.3)
SODIUM: 138 meq/L (ref 137–147)
Total Protein: 6.7 g/dL (ref 6.0–8.3)

## 2014-09-04 LAB — RAPID URINE DRUG SCREEN, HOSP PERFORMED
AMPHETAMINES: NOT DETECTED
BARBITURATES: NOT DETECTED
BENZODIAZEPINES: NOT DETECTED
Cocaine: NOT DETECTED
Opiates: POSITIVE — AB
TETRAHYDROCANNABINOL: POSITIVE — AB

## 2014-09-04 LAB — HEPATITIS PANEL, ACUTE
HCV AB: REACTIVE — AB
HEP B S AG: NEGATIVE
Hep A IgM: NONREACTIVE
Hep B C IgM: NONREACTIVE

## 2014-09-04 LAB — GLUCOSE, CAPILLARY
GLUCOSE-CAPILLARY: 106 mg/dL — AB (ref 70–99)
Glucose-Capillary: 254 mg/dL — ABNORMAL HIGH (ref 70–99)

## 2014-09-04 LAB — HEMOGLOBIN A1C
Hgb A1c MFr Bld: 7.9 % — ABNORMAL HIGH (ref <5.7)
Mean Plasma Glucose: 180 mg/dL — ABNORMAL HIGH (ref <117)

## 2014-09-04 LAB — TSH: TSH: 0.679 u[IU]/mL (ref 0.350–4.500)

## 2014-09-04 LAB — HIV ANTIBODY (ROUTINE TESTING W REFLEX): HIV 1&2 Ab, 4th Generation: NONREACTIVE

## 2014-09-04 LAB — SALICYLATE LEVEL

## 2014-09-04 LAB — PHOSPHORUS: Phosphorus: 4.3 mg/dL (ref 2.3–4.6)

## 2014-09-04 LAB — ACETAMINOPHEN LEVEL: Acetaminophen (Tylenol), Serum: <15 ug/mL (ref 10–30)

## 2014-09-04 LAB — CK
CK TOTAL: 72 U/L (ref 7–232)
Total CK: 64 U/L (ref 7–232)

## 2014-09-04 LAB — MAGNESIUM: Magnesium: 1.7 mg/dL (ref 1.5–2.5)

## 2014-09-04 LAB — PROTIME-INR
INR: 1.12 (ref 0.00–1.49)
Prothrombin Time: 14.4 seconds (ref 11.6–15.2)

## 2014-09-04 MED ORDER — CHLORHEXIDINE GLUCONATE 0.12 % MT SOLN
15.0000 mL | Freq: Two times a day (BID) | OROMUCOSAL | Status: DC
  Administered 2014-09-04 (×2): 15 mL via OROMUCOSAL
  Filled 2014-09-04 (×5): qty 15

## 2014-09-04 MED ORDER — DOCUSATE SODIUM 100 MG PO CAPS
100.0000 mg | ORAL_CAPSULE | Freq: Two times a day (BID) | ORAL | Status: DC
  Administered 2014-09-04 – 2014-09-05 (×3): 100 mg via ORAL
  Filled 2014-09-04 (×4): qty 1

## 2014-09-04 MED ORDER — LIVING WELL WITH DIABETES BOOK
Freq: Once | Status: AC
  Administered 2014-09-04: 13:00:00
  Filled 2014-09-04: qty 1

## 2014-09-04 MED ORDER — GLIPIZIDE 2.5 MG HALF TABLET
2.5000 mg | ORAL_TABLET | Freq: Every day | ORAL | Status: DC
  Administered 2014-09-04: 2.5 mg via ORAL
  Filled 2014-09-04 (×3): qty 1

## 2014-09-04 MED ORDER — SODIUM CHLORIDE 0.9 % IV SOLN
INTRAVENOUS | Status: AC
  Administered 2014-09-04: 03:00:00 via INTRAVENOUS

## 2014-09-04 MED ORDER — THIAMINE HCL 100 MG/ML IJ SOLN
100.0000 mg | Freq: Every day | INTRAMUSCULAR | Status: DC
  Administered 2014-09-04: 100 mg via INTRAVENOUS
  Filled 2014-09-04 (×2): qty 1

## 2014-09-04 MED ORDER — LORAZEPAM 2 MG/ML IJ SOLN
2.0000 mg | INTRAMUSCULAR | Status: DC | PRN
  Administered 2014-09-04: 3 mg via INTRAVENOUS
  Administered 2014-09-04: 2 mg via INTRAVENOUS
  Filled 2014-09-04: qty 1
  Filled 2014-09-04 (×2): qty 2

## 2014-09-04 MED ORDER — FOLIC ACID 1 MG PO TABS
1.0000 mg | ORAL_TABLET | Freq: Every day | ORAL | Status: DC
  Administered 2014-09-04 – 2014-09-05 (×2): 1 mg via ORAL
  Filled 2014-09-04 (×2): qty 1

## 2014-09-04 MED ORDER — HYDRALAZINE HCL 20 MG/ML IJ SOLN
10.0000 mg | Freq: Once | INTRAMUSCULAR | Status: AC
  Administered 2014-09-05: 10 mg via INTRAVENOUS
  Filled 2014-09-04: qty 1

## 2014-09-04 MED ORDER — CETYLPYRIDINIUM CHLORIDE 0.05 % MT LIQD
7.0000 mL | Freq: Two times a day (BID) | OROMUCOSAL | Status: DC
  Administered 2014-09-04: 7 mL via OROMUCOSAL

## 2014-09-04 MED ORDER — QUETIAPINE FUMARATE ER 200 MG PO TB24
200.0000 mg | ORAL_TABLET | Freq: Every day | ORAL | Status: DC
  Administered 2014-09-05: 200 mg via ORAL
  Filled 2014-09-04 (×3): qty 1

## 2014-09-04 MED ORDER — SODIUM CHLORIDE 0.9 % IJ SOLN
3.0000 mL | Freq: Two times a day (BID) | INTRAMUSCULAR | Status: DC
  Administered 2014-09-04 (×2): 3 mL via INTRAVENOUS

## 2014-09-04 MED ORDER — INFLUENZA VAC SPLIT QUAD 0.5 ML IM SUSY
0.5000 mL | PREFILLED_SYRINGE | INTRAMUSCULAR | Status: AC
  Administered 2014-09-05: 0.5 mL via INTRAMUSCULAR
  Filled 2014-09-04 (×2): qty 0.5

## 2014-09-04 MED ORDER — ONDANSETRON HCL 4 MG/2ML IJ SOLN: 4.0000 mg | Freq: Four times a day (QID) | INTRAMUSCULAR | Status: DC | PRN

## 2014-09-04 MED ORDER — LEVETIRACETAM 500 MG PO TABS
500.0000 mg | ORAL_TABLET | Freq: Two times a day (BID) | ORAL | Status: DC
  Administered 2014-09-04 – 2014-09-05 (×4): 500 mg via ORAL
  Filled 2014-09-04 (×5): qty 1

## 2014-09-04 MED ORDER — INSULIN ASPART 100 UNIT/ML ~~LOC~~ SOLN
0.0000 [IU] | Freq: Three times a day (TID) | SUBCUTANEOUS | Status: DC
  Administered 2014-09-04: 8 [IU] via SUBCUTANEOUS
  Administered 2014-09-05: 11 [IU] via SUBCUTANEOUS

## 2014-09-04 MED ORDER — NICOTINE 14 MG/24HR TD PT24
14.0000 mg | MEDICATED_PATCH | Freq: Every day | TRANSDERMAL | Status: DC
  Administered 2014-09-04: 14 mg via TRANSDERMAL
  Filled 2014-09-04 (×2): qty 1

## 2014-09-04 MED ORDER — ACETAMINOPHEN 325 MG PO TABS: 650.0000 mg | ORAL_TABLET | Freq: Four times a day (QID) | ORAL | Status: DC | PRN

## 2014-09-04 MED ORDER — ACETAMINOPHEN 650 MG RE SUPP
650.0000 mg | Freq: Four times a day (QID) | RECTAL | Status: DC | PRN
Start: 2014-09-04 — End: 2014-09-05

## 2014-09-04 MED ORDER — FOLIC ACID 5 MG/ML IJ SOLN: 1.0000 mg | Freq: Every day | INTRAMUSCULAR | Status: DC

## 2014-09-04 MED ORDER — ONDANSETRON HCL 4 MG PO TABS
4.0000 mg | ORAL_TABLET | Freq: Four times a day (QID) | ORAL | Status: DC | PRN
Start: 2014-09-04 — End: 2014-09-05

## 2014-09-04 NOTE — Progress Notes (Signed)
Inpatient Diabetes Program Recommendations  AACE/ADA: New Consensus Statement on Inpatient Glycemic Control (2013)  Target Ranges:  Prepandial:   less than 140 mg/dL      Peak postprandial:   less than 180 mg/dL (1-2 hours)      Critically ill patients:  140 - 180 mg/dL   Reason for Visit: Diabetes Consult  Diabetes history: None Outpatient Diabetes medications: n/a Current orders for Inpatient glycemic control: Novolog moderate tidwc, glipizide 2.5 mg QAM  28 y.o. male with PMH of Suicide; Depression; Anxiety; Depression; Multiple personality disorder (2014); Neuropathy; and TBI (traumatic brain injury). Had fall at home and per family had seizure. Found to have hyperglycemia in the ED. Has family hx of DM. States he is thirsty all of the time. Denies drinking soda, eating sweets. 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 Briefly explained diagnosis of DM, insulin resistance, diet, and monitoring. Will order Living Well book and encouraged pt to view diabetes videos on pt ed channel. Continued to doze off while discussing his diabetes.  Results for LENON, KUENNEN (MRN [id number]) as of 09/04/2014 13:00  Ref. Range 09/03/2014 19:15 09/04/2014 12:38  Glucose-Capillary Latest Range: 70-99 mg/dL 409 (H) 811 (H)  Results for PHILIPE, LASWELL (MRN [id number]) as of 09/04/2014 13:00  Ref. Range 09/04/2014 05:05  Sodium Latest Range: 137-147 mEq/L 138  Potassium Latest Range: 3.7-5.3 mEq/L 4.1  Chloride Latest Range: 96-112 mEq/L 102  CO2 Latest Range: 19-32 mEq/L 23  BUN Latest Range: 6-23 mg/dL 6  Creatinine Latest Range: 0.50-1.35 mg/dL 2.13  Calcium Latest Range: 8.4-10.5 mg/dL 8.9  GFR calc non Af Amer Latest Range: >90 mL/min >90  GFR calc Af Amer Latest Range: >90 mL/min >90  Glucose Latest Range: 70-99 mg/dL 086 (H)  Anion gap Latest Range: 5-15  13   New-onset DM. Will begin inpatient diabetes  education.  Inpatient Diabetes Program Recommendations Correction (SSI): Novolog moderate tidwc Insulin - Meal Coverage: May benefit from addition of Novolog 3 units tidwc for meal coverage insulin Oral Agents: Began glipizide 2.5 mg QAM HgbA1C: Pending Diet: CHO mod med  Note: Will f/u in am.  Thank you. Ailene Ards, RD, LDN, CDE Inpatient Diabetes Coordinator [phone]

## 2014-09-04 NOTE — Progress Notes (Signed)
EEG Completed; Results Pending  
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
## 2014-09-04 NOTE — Progress Notes (Signed)
Nutrition Education Note  Pt discussed during multidisciplinary rounds. Pt with new diagnosis of diabetes.   RD provided "Carbohydrate Counting for People with Diabetes" handout from the Academy of Nutrition and Dietetics. Discussed different food groups and their effects on blood sugar, emphasizing carbohydrate-containing foods. Provided list of carbohydrates and recommended serving sizes of common foods.  Discussed importance of controlled and consistent carbohydrate intake throughout the day. Provided examples of ways to balance meals/snacks and encouraged intake of high-fiber, whole grain complex carbohydrates. Teach back method used.  Pt reports eating 1 meal/day of things like chicken/vegetables. [place] Sp[place]iel he is very aware of diabetic diet because his parents were diabetic. Admits to drinking beer daily. Discussed importance of eating consistent meals throughout the day and avoiding alcohol. Pt expressed understanding.   Expect good compliance.  Body mass index is 30.07 kg/(m^2). Pt meets criteria for class I obesity based on current BMI.  Current diet order is CHO modified, patient is consuming approximately 100% of meals at this time. Labs and medications reviewed. No further nutrition interventions warranted at this time. RD contact information provided. If additional nutrition issues arise, please re-consult RD.  Charlott Rakes MS, RD, LDN [phone] Pager [phone] Weekend/After Hours Pager
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
## 2014-09-05 LAB — COMPREHENSIVE METABOLIC PANEL
ALBUMIN: 3.4 g/dL — AB (ref 3.5–5.2)
ALT: 780 U/L — AB (ref 0–53)
ANION GAP: 12 (ref 5–15)
AST: 742 U/L — ABNORMAL HIGH (ref 0–37)
Alkaline Phosphatase: 129 U/L — ABNORMAL HIGH (ref 39–117)
BUN: 7 mg/dL (ref 6–23)
CO2: 26 mEq/L (ref 19–32)
Calcium: 9.5 mg/dL (ref 8.4–10.5)
Chloride: 99 mEq/L (ref 96–112)
Creatinine, Ser: 0.72 mg/dL (ref 0.50–1.35)
GFR calc Af Amer: >90 mL/min (ref >90)
GFR calc non Af Amer: >90 mL/min (ref >90)
Glucose, Bld: 119 mg/dL — ABNORMAL HIGH (ref 70–99)
POTASSIUM: 4.5 meq/L (ref 3.7–5.3)
SODIUM: 137 meq/L (ref 137–147)
TOTAL PROTEIN: 7 g/dL (ref 6.0–8.3)
Total Bilirubin: 0.8 mg/dL (ref 0.3–1.2)

## 2014-09-05 LAB — GLUCOSE, CAPILLARY
Glucose-Capillary: 157 mg/dL — ABNORMAL HIGH (ref 70–99)
Glucose-Capillary: 133 mg/dL — ABNORMAL HIGH (ref 70–99)
Glucose-Capillary: 340 mg/dL — ABNORMAL HIGH (ref 70–99)

## 2014-09-05 LAB — CBC
HEMATOCRIT: 49.5 % (ref 39.0–52.0)
HEMOGLOBIN: 17.3 g/dL — AB (ref 13.0–17.0)
MCH: 35.5 pg — ABNORMAL HIGH (ref 26.0–34.0)
MCHC: 34.9 g/dL (ref 30.0–36.0)
MCV: 101.4 fL — ABNORMAL HIGH (ref 78.0–100.0)
Platelets: 170 10*3/uL (ref 150–400)
RBC: 4.88 MIL/uL (ref 4.22–5.81)
RDW: 12.3 % (ref 11.5–15.5)
WBC: 5.7 10*3/uL (ref 4.0–10.5)

## 2014-09-05 MED ORDER — CLONIDINE HCL 0.1 MG PO TABS
0.1000 mg | ORAL_TABLET | Freq: Once | ORAL | Status: AC
  Administered 2014-09-05: 0.1 mg via ORAL
  Filled 2014-09-05: qty 1

## 2014-09-05 MED ORDER — FOLIC ACID 1 MG PO TABS: 1.0000 mg | ORAL_TABLET | Freq: Every day | ORAL | Status: DC

## 2014-09-05 MED ORDER — LISINOPRIL 5 MG PO TABS
5.0000 mg | ORAL_TABLET | Freq: Every day | ORAL | Status: DC
  Administered 2014-09-05: 5 mg via ORAL
  Filled 2014-09-05: qty 1

## 2014-09-05 MED ORDER — CLONIDINE HCL 0.1 MG PO TABS: 0.1000 mg | ORAL_TABLET | Freq: Two times a day (BID) | ORAL | Status: DC

## 2014-09-05 MED ORDER — NICOTINE 14 MG/24HR TD PT24: 14.0000 mg | MEDICATED_PATCH | Freq: Every day | TRANSDERMAL | Status: DC

## 2014-09-05 MED ORDER — CLONIDINE HCL 0.1 MG PO TABS
0.1000 mg | ORAL_TABLET | Freq: Two times a day (BID) | ORAL | Status: DC
  Administered 2014-09-05: 0.1 mg via ORAL
  Filled 2014-09-05 (×2): qty 1

## 2014-09-05 MED ORDER — CHLORDIAZEPOXIDE HCL 10 MG PO CAPS
10.0000 mg | ORAL_CAPSULE | Freq: Three times a day (TID) | ORAL | Status: DC
  Administered 2014-09-05: 10 mg via ORAL
  Filled 2014-09-05: qty 1

## 2014-09-05 MED ORDER — FREESTYLE SYSTEM KIT: 1.0000 | PACK | Freq: Three times a day (TID) | Status: AC

## 2014-09-05 MED ORDER — LEVETIRACETAM 500 MG PO TABS: 500.0000 mg | ORAL_TABLET | Freq: Two times a day (BID) | ORAL | Status: DC

## 2014-09-05 MED ORDER — GLIPIZIDE 2.5 MG HALF TABLET: 2.5000 mg | ORAL_TABLET | Freq: Every day | ORAL | Status: DC

## 2014-09-05 MED ORDER — HEPARIN SODIUM (PORCINE) 5000 UNIT/ML IJ SOLN
5000.0000 [IU] | Freq: Three times a day (TID) | INTRAMUSCULAR | Status: DC
  Filled 2014-09-05 (×3): qty 1

## 2014-09-05 MED ORDER — CHLORDIAZEPOXIDE HCL 5 MG PO CAPS: ORAL_CAPSULE | ORAL | Status: DC

## 2014-09-05 MED ORDER — LISINOPRIL 5 MG PO TABS: 5.0000 mg | ORAL_TABLET | Freq: Every day | ORAL | Status: DC

## 2014-09-05 MED ORDER — VITAMIN B-1 100 MG PO TABS: 100.0000 mg | ORAL_TABLET | Freq: Every day | ORAL | Status: DC

## 2014-09-05 NOTE — Discharge Instructions (Signed)
Do not drive, operating heavy machinery, perform activities at heights, swimming or participation in water activities or provide baby sitting services until you have seen by Primary MD or a Neurologist and advised to do so again.  Follow with Primary MD  in 7 days   Get CBC, CMP, 2 view Chest X ray checked  by Primary MD next visit.    Activity: As tolerated with Full fall precautions use walker/cane & assistance as needed   Disposition Home     Diet: Heart Healthy Low Carb  Accuchecks 4 times/day, Once in AM empty stomach and then before each meal. Log in all results and show them to your Prim.MD in 3 days. If any glucose reading is under 80 or above 300 call your Prim MD immidiately. Follow Low glucose instructions for glucose under 80 as instructed.   For Heart failure patients - Check your Weight same time everyday, if you gain over 2 pounds, or you develop in leg swelling, experience more shortness of breath or chest pain, call your Primary MD immediately. Follow Cardiac Low Salt Diet and 1.8 lit/day fluid restriction.   On your next visit with her primary care physician please Get Medicines reviewed and adjusted.  Please request your Prim.MD to go over all Hospital Tests and Procedure/Radiological results at the follow up, please get all Hospital records sent to your Prim MD by signing hospital release before you go home.   If you experience worsening of your admission symptoms, develop shortness of breath, life threatening emergency, suicidal or homicidal thoughts you must seek medical attention immediately by calling 911 or calling your MD immediately  if symptoms less severe.  You Must read complete instructions/literature along with all the possible adverse reactions/side effects for all the Medicines you take and that have been prescribed to you. Take any new Medicines after you have completely understood and accpet all the possible adverse reactions/side effects.   Do not  drive when taking Pain medications.    Do not take more than prescribed Pain, Sleep and Anxiety Medications  Special Instructions: If you have smoked or chewed Tobacco  in the last 2 yrs please stop smoking, stop any regular Alcohol  and or any Recreational drug use.  Wear Seat belts while driving.   Please note  You were cared for by a hospitalist during your hospital stay. If you have any questions about your discharge medications or the care you received while you were in the hospital after you are discharged, you can call the unit and asked to speak with the hospitalist on call if the hospitalist that took care of you is not available. Once you are discharged, your primary care physician will handle any further medical issues. Please note that NO REFILLS for any discharge medications will be authorized once you are discharged, as it is imperative that you return to your primary care physician (or establish a relationship with a primary care physician if you do not have one) for your aftercare needs so that they can reassess your need for medications and monitor your lab values.

## 2014-09-05 NOTE — ED Provider Notes (Signed)
Medical screening examination/treatment/procedure(s) were performed by non-physician practitioner and as supervising physician I was immediately available for consultation/collaboration.   EKG Interpretation   Date/Time:  Monday September 03 2014 19:05:19 EDT Ventricular Rate:  121 PR Interval:  118 QRS Duration: 92 QT Interval:  308 QTC Calculation: 437 R Axis:   51 Text Interpretation:  Sinus tachycardia Septal infarct , age undetermined  T wave abnormality, consider inferior ischemia Abnormal ECG ED PHYSICIAN  INTERPRETATION AVAILABLE IN [facility] Confirmed by TEST, Record  ([id number]) on 09/05/2014 7:06:01 AM        Samuel Jester, DO 09/05/14 1741

## 2014-09-05 NOTE — Progress Notes (Addendum)
Patient wanting to leave AMA, spoke patient about the risk factors and patient stated,"I understand and I still would like to leave," explain to patient the seriousness of his condition by he still wants to leave, Dr notified, patient in fair condition at this time, patient leaving AMA, prescriptions given per doctor's orders, patient leaving with family member

## 2014-09-05 NOTE — Progress Notes (Signed)
Report given to on coming RN Queen who will resume care. Brynnley Dayrit Mahario, RN   
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
## 2014-09-05 NOTE — Care Management Note (Signed)
    Page 1 of 1   09/05/2014     12:05:05 PM CARE MANAGEMENT NOTE 09/05/2014  Patient:  Randall Burnett, Randall Burnett   Account Number:  [id number]  Date Initiated:  09/04/2014  Documentation initiated by:  Lorenda Ishihara  Subjective/Objective Assessment:   28 yo male admitted with ETOH abuse, seizures, new onset DM. PTA lived at home with uncle.     Action/Plan:   Home when stable   Anticipated DC Date:  09/05/2014   Anticipated DC Plan:  HOME/SELF CARE  In-house referral  Clinical Social Worker  Artist      DC Planning Services  CM consult  PCP issues  Outpatient Services - Pt will follow up      Choice offered to / List presented to:             Status of service:  Completed, signed off Medicare Important Message given?   (If response is "NO", the following Medicare IM given date fields will be blank) Date Medicare IM given:   Medicare IM given by:   Date Additional Medicare IM given:   Additional Medicare IM given by:    Discharge Disposition:  HOME/SELF CARE  Per UR Regulation:  Reviewed for med. necessity/level of care/duration of stay  If discussed at Long Length of Stay Meetings, dates discussed:    Comments:  09-05-14 Lorenda Ishihara RN CM 1100 Spoke with patient at bedside regarding follow up care. Patient states he does not have a primary care physician, doesn't have a preference of one. [facility] Contacted the Clinic and they request that patient call them on Thurs or Friday at 9:05 to secure appt. Patient states he can afford medications if they were less that $100. Contacted [facility] and med total will be $86.50 plus $10 for glucometer from [facility]. Patient states he can afford this. Provided written instruction on med cost and where to obtain glucometer. Will review with uncle once he arrives. Made RN aware to contact me once uncle arrives.

## 2014-09-05 NOTE — Progress Notes (Signed)
Subjective: No complaints.  No further seizures.   Objective: Current vital signs: BP 170/110  Pulse 81  Temp(Src) 98.2 F (36.8 C) (Oral)  Resp 18  Ht  (1.727 m)  Wt 89.676 kg (197 lb 11.2 oz)  BMI 30.07 kg/m2  SpO2 98% Vital signs in last 24 hours: Temp:  [97.7 F (36.5 C)-98.2 F (36.8 C)] 98.2 F (36.8 C) (09/23 0500) Pulse Rate:  [81-96] 81 (09/23 0500) Resp:  [18-20] 18 (09/23 0500) BP: (149-170)/(92-122) 170/110 mmHg (09/23 0500) SpO2:  [98 %] 98 % (09/23 0500)  Intake/Output from previous day: 09/22 0701 - 09/23 0700 In: 720 [P.O.:720] Out: 2 [Urine:2] Intake/Output this shift:   Nutritional status: Carb Control  Neurologic Exam: General: NAD Mental Status: Alert, oriented, thought content appropriate.  Speech fluent without evidence of aphasia.  Able to follow 3 step commands without difficulty. Cranial Nerves: II:  Visual fields grossly normal, pupils equal, round, reactive to light and accommodation III,IV, VI: ptosis not present, extra-ocular motions intact bilaterally V,VII: smile symmetric, facial light touch sensation normal bilaterally VIII: hearing normal bilaterally IX,X: gag reflex present XI: bilateral shoulder shrug XII: midline tongue extension without atrophy or fasciculations  Motor: Right : Upper extremity   5/5    Left:     Upper extremity   5/5  Lower extremity   5/5     Lower extremity   5/5 Tone and bulk:normal tone throughout; no atrophy noted Sensory: Pinprick and light touch intact throughout, bilaterally Deep Tendon Reflexes:  Right: Upper Extremity   Left: Upper extremity   biceps (C-5 to C-6) 2/4   biceps (C-5 to C-6) 2/4 tricep (C7) 2/4    triceps (C7) 2/4 Brachioradialis (C6) 2/4  Brachioradialis (C6) 2/4  Lower Extremity Lower Extremity  quadriceps (L-2 to L-4) 2/4   quadriceps (L-2 to L-4) 2/4 Achilles (S1) 2/4   Achilles (S1) 2/4  Plantars: Right: downgoing   Left: downgoing Cerebellar: normal finger-to-nose,   normal heel-to-shin test Gait: not tested CV: pulses palpable throughout    Lab Results: Basic Metabolic Panel:  Recent Labs Lab 09/03/14 1908 09/04/14 0505 09/05/14 0522  NA 132* 138 137  K 4.1 4.1 4.5  CL 93* 102 99  CO2 GLUCOSE 271* 154* 119*  BUN CREATININE 0.82 0.84 0.72  CALCIUM 9.0 8.9 9.5  MG  --  1.7  --   PHOS  --  4.3  --     Liver Function Tests:  Recent Labs Lab 09/03/14 1908 09/04/14 0505 09/05/14 0522  AST 735* 661* 742*  ALT 918* 786* 780*  ALKPHOS 142* 112 129*  BILITOT 0.9 0.7 0.8  PROT 7.8 6.7 7.0  ALBUMIN 3.7 3.3* 3.4*   No results found for this basename: LIPASE, AMYLASE,  in the last 168 hours No results found for this basename: AMMONIA,  in the last 168 hours  CBC:  Recent Labs Lab 09/03/14 1907 09/04/14 0505 09/05/14 0522  WBC 5.7 6.7 5.7  NEUTROABS 2.8  --   --   HGB 18.2* 16.0 17.3*  HCT 51.9 46.9 49.5  MCV 101.4* 102.6* 101.4*  PLT 198 185 170    Cardiac Enzymes:  Recent Labs Lab 09/03/14 1940 09/03/14 2308 09/04/14 0505  CKTOTAL  --  64 72  TROPONINI <0.30  --   --     Lipid Panel: No results found for this basename: CHOL, TRIG, HDL, CHOLHDL, VLDL, LDLCALC,  in the last 168 hours  CBG:  Recent Labs Lab 09/03/14 1915 09/04/14 1238 09/04/14 1613 09/04/14 2216 09/05/14 0739  GLUCAP 274* 254* 106* 157* 133*    Microbiology: No results found for this or any previous visit.  Coagulation Studies:  Recent Labs  09/04/14 0505  LABPROT 14.4  INR 1.12    Imaging: Dg Chest 2 View  09/03/2014   CLINICAL DATA:  Seizures.  EXAM: CHEST  2 VIEW  COMPARISON:  No priors.  FINDINGS: Lung volumes are low. There are some bibasilar opacities favored to reflect subsegmental atelectasis. No acute consolidative airspace disease. No pleural effusions. No evidence of pulmonary edema. Heart size is normal. Upper mediastinal contours are within normal limits.  IMPRESSION: 1. Low lung volumes with  bibasilar subsegmental atelectasis.   Electronically Signed   By: Trudie Reed M.D.   On: 09/03/2014 19:42   Ct Head Wo Contrast  09/04/2014   CLINICAL DATA:  Seizure activity with head trauma.  EXAM: CT HEAD WITHOUT CONTRAST  TECHNIQUE: Contiguous axial images were obtained from the base of the skull through the vertex without intravenous contrast.  COMPARISON:  03/17/2011.  FINDINGS: Skull and Sinuses:There is a nondisplaced right supraorbital frontal bone fracture with overlying forehead contusion or laceration. No evidence of continuation into the orbit. Subcutaneous punctate density in the region of soft tissue injury could reflect tiny foreign body or calcification that has developed since prior.  Orbits: No acute abnormality.  Brain: No evidence of acute abnormality, such as acute infarction, hemorrhage, hydrocephalus, or mass lesion/mass effect.  IMPRESSION: 1. No acute intracranial findings. 2. Nondepressed right frontal bone fracture.   Electronically Signed   By: Tiburcio Pea M.D.   On: 09/04/2014 02:13   Ct Angio Chest Pe W/cm &/or Wo Cm  09/03/2014   CLINICAL DATA:  Difficulty breathing and chest pain  EXAM: CT ANGIOGRAPHY CHEST WITH CONTRAST  TECHNIQUE: Multidetector CT imaging of the chest was performed using the standard protocol during bolus administration of intravenous contrast. Multiplanar CT image reconstructions and MIPs were obtained to evaluate the vascular anatomy.  CONTRAST:  OMNIPAQUE IOHEXOL 350 MG/ML SOLN  COMPARISON:  Chest radiograph September 03, 2014  FINDINGS: There is no demonstrable pulmonary embolus. There is no thoracic aortic aneurysm or dissection.  There is no lung edema or consolidation. There is rather minimal right lower lobe atelectatic change.  There is no appreciable thoracic adenopathy. The pericardium is not thickened.  In the visualized upper abdomen there is fatty change in the liver. There are no blastic or lytic bone lesions. Visualized thyroid  appears normal.  Review of the MIP images confirms the above findings.  IMPRESSION: No demonstrable pulmonary embolus. No edema or consolidation. Fatty change in liver.   Electronically Signed   By: Bretta Bang M.D.   On: 09/03/2014 21:30   US Abdomen Limited Ruq  09/04/2014   CLINICAL DATA:  Elevated LFTs  EXAM: US ABDOMEN LIMITED - RIGHT UPPER QUADRANT  COMPARISON:  None  FINDINGS: Gallbladder:  Normally distended without stones or wall thickening.  No pericholecystic fluid or sonographic Murphy sign.  Common bile duct:  Diameter: Upper normal caliber 6 mm diameter  Liver:  Subjectively appears enlarged, at least 20 cm length. Increased hepatic echogenicity question fatty infiltration though this can be seen with cirrhosis and certain infiltrative disorders. No focal hepatic mass or nodularity. Hepatopetal portal venous flow.  No RIGHT upper quadrant ascites.  IMPRESSION: Question fatty infiltration and mild enlargement of liver as above.  Upper normal CBD without  intrahepatic biliary dilatation.   Electronically Signed   By: Ulyses Southward M.D.   On: 09/04/2014 11:06    Medications:  Scheduled: . antiseptic oral rinse  7 mL Mouth Rinse q12n4p  . chlorhexidine  15 mL Mouth Rinse BID  . cloNIDine  0.1 mg Oral BID  . docusate sodium  100 mg Oral BID  . folic acid  1 mg Oral Daily  . glipiZIDE  2.5 mg Oral QAC breakfast  . insulin aspart  0-15 Units Subcutaneous TID WC  . levETIRAcetam  500 mg Oral BID  . lisinopril  5 mg Oral Daily  . nicotine  14 mg Transdermal Daily  . QUEtiapine  200 mg Oral QHS  . sodium chloride  3 mL Intravenous Q12H  . thiamine  100 mg Intravenous Daily   EEG: This is a normal EEG recording during wakefulness and during sleep. No evidence of epileptic activity was recorded.  Assessment/Plan: 28 year old male a history of traumatic brain injury as well as substance abuse and seizures in the past. patient presented with seizure in setting of decreased ETOH intake.   Started on Keppra given history of TBI and multiple seizures.   Recommend:  1) Continue Keppra 500 mg BID  2) Will need follow up with out patient neurology at time of discharge.  3) No driving, operating heavy machinery, perform activities at heights, swimming or participation in water activities until release by outpatient physician.  This has been discussed with patient.  4) primary plans to place on librium taper for DT's  Felicie Morn PA-C [facility] [phone]  09/05/2014, 8:56 AM

## 2014-09-05 NOTE — Discharge Summary (Signed)
Left AMA   Patient at this time expresses desire to leave the Hospital immidiately, patient has been warned that this is not Medically advisable at this time, and can result in Medical complications like Death and Disability, patient understands and accepts the risks involved and assumes full responsibilty of this decision.      Last note                                            Patient Demographics  Randall Burnett, is a 28 y.o. male, DOB - November 29, 1986, ZOX:[id number]  Admit date - 09/03/2014   Admitting Physician Therisa Doyne, MD  Outpatient Primary MD for the patient is No PCP Per Patient  LOS - 2   Chief Complaint  Patient presents with  . Loss of Consciousness        Subjective:   Randall Burnett today has, No headache, No chest pain, No abdominal pain - No Nausea, No new weakness tingling or numbness, No Cough - SOB.    Assessment & Plan    1. Alcohol abuse-polysubstance abuse. Counseled to quit all, likely in early DTs, placed on Librium April along with folic acid and thiamine, continue CIWA protocol. Social work is following.   2. Seizures x3 this admission. EEG unremarkable, head CT unremarkable, placed on Keppra by neurology. Now seizure-free. 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 Seizure instructions provided in writing.   3. DM type II new diagnosis. A1c was 7.9, seen by diabetic coordinator, stable on oral hypoglycemic which will be continued.   CBG (last 3)   Recent Labs  09/04/14 1613 09/04/14 2216 09/05/14 0739  GLUCAP 106* 157* 133*     4. Transaminitis. Hep C serology positive, history of IV drug use, right upper quadrant ultrasound non specific, GI consulted HIV RNA load ordered. Counseled to quit alcohol and IV drugs.         Code Status: Full  Family  Communication: None present  Disposition Plan: Home   Procedures right upper quadrant ultrasound   Consults  GI called   Medications  Scheduled Meds: . antiseptic oral rinse  7 mL Mouth Rinse q12n4p  . chlordiazePOXIDE  10 mg Oral TID  . chlorhexidine  15 mL Mouth Rinse BID  . cloNIDine  0.1 mg Oral BID  . docusate sodium  100 mg Oral BID  . folic acid  1 mg Oral Daily  . glipiZIDE  2.5 mg Oral QAC breakfast  . heparin subcutaneous  5,000 Units Subcutaneous 3 times per day  . insulin aspart  0-15 Units Subcutaneous TID WC  . levETIRAcetam  500 mg Oral BID  . lisinopril  5 mg Oral Daily  . nicotine  14 mg Transdermal Daily  . QUEtiapine  200 mg Oral QHS  . sodium chloride  3 mL Intravenous Q12H  . thiamine  100 mg Intravenous Daily   Continuous Infusions:  PRN Meds:.acetaminophen, acetaminophen, LORazepam, ondansetron (ZOFRAN) IV, ondansetron  DVT Prophylaxis  Heparin   Lab Results  Component Value Date   PLT 170 09/05/2014    Antibiotics    Anti-infectives   None          Objective:   Filed Vitals:   09/04/14 2246 09/05/14 0104 09/05/14 0500 09/05/14 1059  BP: 161/122 153/105 170/110 144/88  Pulse: 90 88 81 78  Temp:   98.2 F (  36.8 C) 98.3 F (36.8 C)  TempSrc:   Oral Oral  Resp:   18 18  Height:      Weight:      SpO2:   98% 97%    Wt Readings from Last 3 Encounters:  09/04/14 89.676 kg (197 lb 11.2 oz)  03/07/14 90.719 kg (200 lb)     Intake/Output Summary (Last 24 hours) at 09/05/14 1133 Last data filed at 09/04/14 1900  Gross per 24 hour  Intake    480 ml  Output      2 ml  Net    478 ml     Physical Exam  Awake Alert, Oriented X 3, No new F.N deficits, Normal affect [place].AT,PERRAL Supple Neck,No JVD, No cervical lymphadenopathy appriciated.  Symmetrical Chest wall movement, Good air movement bilaterally, CTAB RRR,No Gallops,Rubs or new Murmurs, No Parasternal Heave +ve B.Sounds, Abd Soft, No tenderness, No organomegaly  appriciated, No rebound - guarding or rigidity. No Cyanosis, Clubbing or edema, No new Rash or bruise      Data Review   Micro Results No results found for this or any previous visit (from the past 240 hour(s)).  Radiology Reports Dg Chest 2 View  09/03/2014   CLINICAL DATA:  Seizures.  EXAM: CHEST  2 VIEW  COMPARISON:  No priors.  FINDINGS: Lung volumes are low. There are some bibasilar opacities favored to reflect subsegmental atelectasis. No acute consolidative airspace disease. No pleural effusions. No evidence of pulmonary edema. Heart size is normal. Upper mediastinal contours are within normal limits.  IMPRESSION: 1. Low lung volumes with bibasilar subsegmental atelectasis.   Electronically Signed   By: Trudie Reed M.D.   On: 09/03/2014 19:42   Ct Head Wo Contrast  09/04/2014   CLINICAL DATA:  Seizure activity with head trauma.  EXAM: CT HEAD WITHOUT CONTRAST  TECHNIQUE: Contiguous axial images were obtained from the base of the skull through the vertex without intravenous contrast.  COMPARISON:  03/17/2011.  FINDINGS: Skull and Sinuses:There is a nondisplaced right supraorbital frontal bone fracture with overlying forehead contusion or laceration. No evidence of continuation into the orbit. Subcutaneous punctate density in the region of soft tissue injury could reflect tiny foreign body or calcification that has developed since prior.  Orbits: No acute abnormality.  Brain: No evidence of acute abnormality, such as acute infarction, hemorrhage, hydrocephalus, or mass lesion/mass effect.  IMPRESSION: 1. No acute intracranial findings. 2. Nondepressed right frontal bone fracture.   Electronically Signed   By: Tiburcio Pea M.D.   On: 09/04/2014 02:13   Ct Angio Chest Pe W/cm &/or Wo Cm  09/03/2014   CLINICAL DATA:  Difficulty breathing and chest pain  EXAM: CT ANGIOGRAPHY CHEST WITH CONTRAST  TECHNIQUE: Multidetector CT imaging of the chest was performed using the standard protocol  during bolus administration of intravenous contrast. Multiplanar CT image reconstructions and MIPs were obtained to evaluate the vascular anatomy.  CONTRAST:  OMNIPAQUE IOHEXOL 350 MG/ML SOLN  COMPARISON:  Chest radiograph September 03, 2014  FINDINGS: There is no demonstrable pulmonary embolus. There is no thoracic aortic aneurysm or dissection.  There is no lung edema or consolidation. There is rather minimal right lower lobe atelectatic change.  There is no appreciable thoracic adenopathy. The pericardium is not thickened.  In the visualized upper abdomen there is fatty change in the liver. There are no blastic or lytic bone lesions. Visualized thyroid appears normal.  Review of the MIP images confirms the above findings.  IMPRESSION:  No demonstrable pulmonary embolus. No edema or consolidation. Fatty change in liver.   Electronically Signed   By: Bretta Bang M.D.   On: 09/03/2014 21:30   US Abdomen Limited Ruq  09/04/2014   CLINICAL DATA:  Elevated LFTs  EXAM: US ABDOMEN LIMITED - RIGHT UPPER QUADRANT  COMPARISON:  None  FINDINGS: Gallbladder:  Normally distended without stones or wall thickening.  No pericholecystic fluid or sonographic Murphy sign.  Common bile duct:  Diameter: Upper normal caliber 6 mm diameter  Liver:  Subjectively appears enlarged, at least 20 cm length. Increased hepatic echogenicity question fatty infiltration though this can be seen with cirrhosis and certain infiltrative disorders. No focal hepatic mass or nodularity. Hepatopetal portal venous flow.  No RIGHT upper quadrant ascites.  IMPRESSION: Question fatty infiltration and mild enlargement of liver as above.  Upper normal CBD without intrahepatic biliary dilatation.   Electronically Signed   By: Ulyses Southward M.D.   On: 09/04/2014 11:06     CBC  Recent Labs Lab 09/03/14 1907 09/04/14 0505 09/05/14 0522  WBC 5.7 6.7 5.7  HGB 18.2* 16.0 17.3*  HCT 51.9 46.9 49.5  PLT 198 185 170  MCV 101.4* 102.6* 101.4*    MCH 35.5* 35.0* 35.5*  MCHC 35.1 34.1 34.9  RDW 12.3 12.3 12.3  LYMPHSABS 2.1  --   --   MONOABS 0.7  --   --   EOSABS 0.1  --   --   BASOSABS 0.0  --   --     Chemistries   Recent Labs Lab 09/03/14 1908 09/04/14 0505 09/05/14 0522  NA 132* 138 137  K 4.1 4.1 4.5  CL 93* 102 99  CO2 GLUCOSE 271* 154* 119*  BUN CREATININE 0.82 0.84 0.72  CALCIUM 9.0 8.9 9.5  MG  --  1.7  --   AST 735* 661* 742*  ALT 918* 786* 780*  ALKPHOS 142* 112 129*  BILITOT 0.9 0.7 0.8   ------------------------------------------------------------------------------------------------------------------ estimated creatinine clearance is 152.2 ml/min (by C-G formula based on Cr of 0.72). ------------------------------------------------------------------------------------------------------------------  Recent Labs  09/04/14 0505  HGBA1C 7.9*   ------------------------------------------------------------------------------------------------------------------ No results found for this basename: CHOL, HDL, LDLCALC, TRIG, CHOLHDL, LDLDIRECT,  in the last 72 hours ------------------------------------------------------------------------------------------------------------------  Recent Labs  09/04/14 0505  TSH 0.679   ------------------------------------------------------------------------------------------------------------------ No results found for this basename: VITAMINB12, FOLATE, FERRITIN, TIBC, IRON, RETICCTPCT,  in the last 72 hours  Coagulation profile  Recent Labs Lab 09/04/14 0505  INR 1.12     Recent Labs  09/03/14 1907  DDIMER 0.55*    Cardiac Enzymes  Recent Labs Lab 09/03/14 1940  TROPONINI <0.30   ------------------------------------------------------------------------------------------------------------------ No components found with this basename: POCBNP,      Time Spent in minutes  35   SINGH,PRASHANT K M.D on 09/05/2014 at 11:33  AM  Between 7am to 7pm - Pager - [phone]  After 7pm go to [email or web address] - password [email or web address]  And look for the night coverage person covering for me after hours  [facility] Office  [phone]   **Disclaimer: This note may have been dictated with voice recognition software. Similar sounding words can inadvertently be transcribed and this note may contain transcription errors which may not have been corrected upon publication of note.**

## 2014-09-05 NOTE — Progress Notes (Signed)
Patient Demographics  Randall Burnett, is a 28 y.o. male, DOB - 11-13-1986, ZOX:[id number]  Admit date - 09/03/2014   Admitting Physician Therisa Doyne, MD  Outpatient Primary MD for the patient is No PCP Per Patient  LOS - 2   Chief Complaint  Patient presents with  . Loss of Consciousness        Subjective:   [name] today has, No headache, No chest pain, No abdominal pain - No Nausea, No new weakness tingling or numbness, No Cough - SOB.    Assessment & Plan    1. Alcohol abuse-polysubstance abuse. Counseled to quit all, likely in early DTs, placed on Librium April along with folic acid and thiamine, continue CIWA protocol. Social work is following.   2. Seizures x3 this admission. EEG unremarkable, head CT unremarkable, placed on Keppra by neurology. Now seizure-free. 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 Seizure instructions provided in writing.   3. DM type II new diagnosis. A1c was 7.9, seen by diabetic coordinator, stable on oral hypoglycemic which will be continued.   CBG (last 3)   Recent Labs  09/04/14 1613 09/04/14 2216 09/05/14 0739  GLUCAP 106* 157* 133*     4. Transaminitis. Hep C serology positive, history of IV drug use, right upper quadrant ultrasound non specific, GI consulted HIV RNA load ordered. Counseled to quit alcohol and IV drugs.         Code Status: Full  Family Communication: None present  Disposition Plan: Home   Procedures right upper quadrant ultrasound   Consults  GI called   Medications  Scheduled Meds: . antiseptic oral rinse  7 mL Mouth Rinse q12n4p  . chlordiazePOXIDE  10 mg Oral TID  . chlorhexidine  15 mL Mouth Rinse BID  . cloNIDine  0.1 mg Oral BID  . docusate sodium  100 mg Oral BID  .  folic acid  1 mg Oral Daily  . glipiZIDE  2.5 mg Oral QAC breakfast  . insulin aspart  0-15 Units Subcutaneous TID WC  . levETIRAcetam  500 mg Oral BID  . lisinopril  5 mg Oral Daily  . nicotine  14 mg Transdermal Daily  . QUEtiapine  200 mg Oral QHS  . sodium chloride  3 mL Intravenous Q12H  . thiamine  100 mg Intravenous Daily   Continuous Infusions:  PRN Meds:.acetaminophen, acetaminophen, LORazepam, ondansetron (ZOFRAN) IV, ondansetron  DVT Prophylaxis  Heparin   Lab Results  Component Value Date   PLT 170 09/05/2014    Antibiotics    Anti-infectives   None          Objective:   Filed Vitals:   09/04/14 2246 09/05/14 0104 09/05/14 0500 09/05/14 1059  BP: 161/122 153/105 170/110 144/88  Pulse: 90 88 81 78  Temp:   98.2 F (36.8 C) 98.3 F (36.8 C)  TempSrc:   Oral Oral  Resp:   18 18  Height:      Weight:      SpO2:   98% 97%    Wt Readings from Last 3 Encounters:  09/04/14 89.676 kg (197 lb 11.2 oz)  03/07/14 90.719 kg (200 lb)     Intake/Output Summary (Last 24 hours) at 09/05/14 1110 Last  data filed at 09/04/14 1900  Gross per 24 hour  Intake    480 ml  Output      2 ml  Net    478 ml     Physical Exam  Awake Alert, Oriented X 3, No new F.N deficits, Normal affect [place].AT,PERRAL Supple Neck,No JVD, No cervical lymphadenopathy appriciated.  Symmetrical Chest wall movement, Good air movement bilaterally, CTAB RRR,No Gallops,Rubs or new Murmurs, No Parasternal Heave +ve B.Sounds, Abd Soft, No tenderness, No organomegaly appriciated, No rebound - guarding or rigidity. No Cyanosis, Clubbing or edema, No new Rash or bruise      Data Review   Micro Results No results found for this or any previous visit (from the past 240 hour(s)).  Radiology Reports Dg Chest 2 View  09/03/2014   CLINICAL DATA:  Seizures.  EXAM: CHEST  2 VIEW  COMPARISON:  No priors.  FINDINGS: Lung volumes are low. There are some bibasilar opacities favored to reflect  subsegmental atelectasis. No acute consolidative airspace disease. No pleural effusions. No evidence of pulmonary edema. Heart size is normal. Upper mediastinal contours are within normal limits.  IMPRESSION: 1. Low lung volumes with bibasilar subsegmental atelectasis.   Electronically Signed   By: Trudie Reed M.D.   On: 09/03/2014 19:42   Ct Head Wo Contrast  09/04/2014   CLINICAL DATA:  Seizure activity with head trauma.  EXAM: CT HEAD WITHOUT CONTRAST  TECHNIQUE: Contiguous axial images were obtained from the base of the skull through the vertex without intravenous contrast.  COMPARISON:  03/17/2011.  FINDINGS: Skull and Sinuses:There is a nondisplaced right supraorbital frontal bone fracture with overlying forehead contusion or laceration. No evidence of continuation into the orbit. Subcutaneous punctate density in the region of soft tissue injury could reflect tiny foreign body or calcification that has developed since prior.  Orbits: No acute abnormality.  Brain: No evidence of acute abnormality, such as acute infarction, hemorrhage, hydrocephalus, or mass lesion/mass effect.  IMPRESSION: 1. No acute intracranial findings. 2. Nondepressed right frontal bone fracture.   Electronically Signed   By: Tiburcio Pea M.D.   On: 09/04/2014 02:13   Ct Angio Chest Pe W/cm &/or Wo Cm  09/03/2014   CLINICAL DATA:  Difficulty breathing and chest pain  EXAM: CT ANGIOGRAPHY CHEST WITH CONTRAST  TECHNIQUE: Multidetector CT imaging of the chest was performed using the standard protocol during bolus administration of intravenous contrast. Multiplanar CT image reconstructions and MIPs were obtained to evaluate the vascular anatomy.  CONTRAST:  OMNIPAQUE IOHEXOL 350 MG/ML SOLN  COMPARISON:  Chest radiograph September 03, 2014  FINDINGS: There is no demonstrable pulmonary embolus. There is no thoracic aortic aneurysm or dissection.  There is no lung edema or consolidation. There is rather minimal right lower  lobe atelectatic change.  There is no appreciable thoracic adenopathy. The pericardium is not thickened.  In the visualized upper abdomen there is fatty change in the liver. There are no blastic or lytic bone lesions. Visualized thyroid appears normal.  Review of the MIP images confirms the above findings.  IMPRESSION: No demonstrable pulmonary embolus. No edema or consolidation. Fatty change in liver.   Electronically Signed   By: Bretta Bang M.D.   On: 09/03/2014 21:30   US Abdomen Limited Ruq  09/04/2014   CLINICAL DATA:  Elevated LFTs  EXAM: US ABDOMEN LIMITED - RIGHT UPPER QUADRANT  COMPARISON:  None  FINDINGS: Gallbladder:  Normally distended without stones or wall thickening.  No pericholecystic fluid or  sonographic Murphy sign.  Common bile duct:  Diameter: Upper normal caliber 6 mm diameter  Liver:  Subjectively appears enlarged, at least 20 cm length. Increased hepatic echogenicity question fatty infiltration though this can be seen with cirrhosis and certain infiltrative disorders. No focal hepatic mass or nodularity. Hepatopetal portal venous flow.  No RIGHT upper quadrant ascites.  IMPRESSION: Question fatty infiltration and mild enlargement of liver as above.  Upper normal CBD without intrahepatic biliary dilatation.   Electronically Signed   By: Ulyses Southward M.D.   On: 09/04/2014 11:06     CBC  Recent Labs Lab 09/03/14 1907 09/04/14 0505 09/05/14 0522  WBC 5.7 6.7 5.7  HGB 18.2* 16.0 17.3*  HCT 51.9 46.9 49.5  PLT 198 185 170  MCV 101.4* 102.6* 101.4*  MCH 35.5* 35.0* 35.5*  MCHC 35.1 34.1 34.9  RDW 12.3 12.3 12.3  LYMPHSABS 2.1  --   --   MONOABS 0.7  --   --   EOSABS 0.1  --   --   BASOSABS 0.0  --   --     Chemistries   Recent Labs Lab 09/03/14 1908 09/04/14 0505 09/05/14 0522  NA 132* 138 137  K 4.1 4.1 4.5  CL 93* 102 99  CO2 GLUCOSE 271* 154* 119*  BUN CREATININE 0.82 0.84 0.72  CALCIUM 9.0 8.9 9.5  MG  --  1.7  --   AST 735* 661*  742*  ALT 918* 786* 780*  ALKPHOS 142* 112 129*  BILITOT 0.9 0.7 0.8   ------------------------------------------------------------------------------------------------------------------ estimated creatinine clearance is 152.2 ml/min (by C-G formula based on Cr of 0.72). ------------------------------------------------------------------------------------------------------------------  Recent Labs  09/04/14 0505  HGBA1C 7.9*   ------------------------------------------------------------------------------------------------------------------ No results found for this basename: CHOL, HDL, LDLCALC, TRIG, CHOLHDL, LDLDIRECT,  in the last 72 hours ------------------------------------------------------------------------------------------------------------------  Recent Labs  09/04/14 0505  TSH 0.679   ------------------------------------------------------------------------------------------------------------------ No results found for this basename: VITAMINB12, FOLATE, FERRITIN, TIBC, IRON, RETICCTPCT,  in the last 72 hours  Coagulation profile  Recent Labs Lab 09/04/14 0505  INR 1.12     Recent Labs  09/03/14 1907  DDIMER 0.55*    Cardiac Enzymes  Recent Labs Lab 09/03/14 1940  TROPONINI <0.30   ------------------------------------------------------------------------------------------------------------------ No components found with this basename: POCBNP,      Time Spent in minutes  35   Xayla Puzio K M.D on 09/05/2014 at 11:10 AM  Between 7am to 7pm - Pager - [phone]  After 7pm go to [email or web address] - password [email or web address]  And look for the night coverage person covering for me after hours  [facility] Office  [phone]   **Disclaimer: This note may have been dictated with voice recognition software. Similar sounding words can inadvertently be transcribed and this note may contain transcription errors which may not have been corrected upon  publication of note.**

## 2014-09-06 LAB — HCV RNA QUANT
HCV QUANT LOG: 6.38 {Log} — AB (ref <1.18)
HCV Quantitative: 2392113 IU/mL — ABNORMAL HIGH (ref <15)

## 2014-11-24 ENCOUNTER — Emergency Department (HOSPITAL_COMMUNITY): Payer: Self-pay

## 2014-11-24 ENCOUNTER — Inpatient Hospital Stay (HOSPITAL_COMMUNITY)
Admission: EM | Admit: 2014-11-24 | Discharge: 2014-11-29 | DRG: 439 | Disposition: A | Discharge status: Home / Self Care | Payer: Self-pay | Attending: Internal Medicine | Admitting: Internal Medicine

## 2014-11-24 ENCOUNTER — Encounter (HOSPITAL_COMMUNITY): Payer: Self-pay | Admitting: Emergency Medicine

## 2014-11-24 DIAGNOSIS — B1920 Unspecified viral hepatitis C without hepatic coma: Secondary | ICD-10-CM

## 2014-11-24 DIAGNOSIS — F10239 Alcohol dependence with withdrawal, unspecified: Secondary | ICD-10-CM | POA: Diagnosis present

## 2014-11-24 DIAGNOSIS — Z9114 Patient's other noncompliance with medication regimen: Secondary | ICD-10-CM | POA: Diagnosis present

## 2014-11-24 DIAGNOSIS — F1910 Other psychoactive substance abuse, uncomplicated: Secondary | ICD-10-CM

## 2014-11-24 DIAGNOSIS — F418 Other specified anxiety disorders: Secondary | ICD-10-CM

## 2014-11-24 DIAGNOSIS — Z79899 Other long term (current) drug therapy: Secondary | ICD-10-CM

## 2014-11-24 DIAGNOSIS — R1031 Right lower quadrant pain: Secondary | ICD-10-CM

## 2014-11-24 DIAGNOSIS — Z23 Encounter for immunization: Secondary | ICD-10-CM

## 2014-11-24 DIAGNOSIS — Z811 Family history of alcohol abuse and dependence: Secondary | ICD-10-CM

## 2014-11-24 DIAGNOSIS — R45851 Suicidal ideations: Secondary | ICD-10-CM

## 2014-11-24 DIAGNOSIS — Z8669 Personal history of other diseases of the nervous system and sense organs: Secondary | ICD-10-CM

## 2014-11-24 DIAGNOSIS — F4481 Dissociative identity disorder: Secondary | ICD-10-CM

## 2014-11-24 DIAGNOSIS — G40909 Epilepsy, unspecified, not intractable, without status epilepticus: Secondary | ICD-10-CM | POA: Diagnosis present

## 2014-11-24 DIAGNOSIS — I10 Essential (primary) hypertension: Secondary | ICD-10-CM

## 2014-11-24 DIAGNOSIS — R000 Tachycardia, unspecified: Secondary | ICD-10-CM | POA: Diagnosis present

## 2014-11-24 DIAGNOSIS — K859 Acute pancreatitis without necrosis or infection, unspecified: Secondary | ICD-10-CM

## 2014-11-24 DIAGNOSIS — F17210 Nicotine dependence, cigarettes, uncomplicated: Secondary | ICD-10-CM | POA: Diagnosis present

## 2014-11-24 DIAGNOSIS — F1010 Alcohol abuse, uncomplicated: Secondary | ICD-10-CM | POA: Diagnosis present

## 2014-11-24 DIAGNOSIS — K920 Hematemesis: Secondary | ICD-10-CM

## 2014-11-24 DIAGNOSIS — Z87820 Personal history of traumatic brain injury: Secondary | ICD-10-CM

## 2014-11-24 DIAGNOSIS — R109 Unspecified abdominal pain: Secondary | ICD-10-CM

## 2014-11-24 DIAGNOSIS — E872 Acidosis: Secondary | ICD-10-CM

## 2014-11-24 DIAGNOSIS — Z833 Family history of diabetes mellitus: Secondary | ICD-10-CM

## 2014-11-24 DIAGNOSIS — G629 Polyneuropathy, unspecified: Secondary | ICD-10-CM | POA: Diagnosis present

## 2014-11-24 DIAGNOSIS — E119 Type 2 diabetes mellitus without complications: Secondary | ICD-10-CM

## 2014-11-24 HISTORY — DX: Essential (primary) hypertension: I10

## 2014-11-24 HISTORY — DX: Type 2 diabetes mellitus without complications: E11.9

## 2014-11-24 LAB — CBC WITH DIFFERENTIAL/PLATELET
Basophils Absolute: 0 10*3/uL (ref 0.0–0.1)
Basophils Relative: 0 % (ref 0–1)
EOS PCT: 0 % (ref 0–5)
Eosinophils Absolute: 0.1 10*3/uL (ref 0.0–0.7)
HEMATOCRIT: 46.7 % (ref 39.0–52.0)
HEMOGLOBIN: 17 g/dL (ref 13.0–17.0)
LYMPHS ABS: 2.2 10*3/uL (ref 0.7–4.0)
LYMPHS PCT: 12 % (ref 12–46)
MCH: 33.7 pg (ref 26.0–34.0)
MCHC: 36.4 g/dL — ABNORMAL HIGH (ref 30.0–36.0)
MCV: 92.5 fL (ref 78.0–100.0)
Monocytes Absolute: 1.3 10*3/uL — ABNORMAL HIGH (ref 0.1–1.0)
Monocytes Relative: 7 % (ref 3–12)
Neutro Abs: 14.2 10*3/uL — ABNORMAL HIGH (ref 1.7–7.7)
Neutrophils Relative %: 81 % — ABNORMAL HIGH (ref 43–77)
PLATELETS: 186 10*3/uL (ref 150–400)
RBC: 5.05 MIL/uL (ref 4.22–5.81)
RDW: 13.7 % (ref 11.5–15.5)
WBC: 17.8 10*3/uL — AB (ref 4.0–10.5)

## 2014-11-24 LAB — LDL CHOLESTEROL, DIRECT: LDL DIRECT: 37 mg/dL

## 2014-11-24 LAB — URINALYSIS, ROUTINE W REFLEX MICROSCOPIC
Bilirubin Urine: NEGATIVE
GLUCOSE, UA: NEGATIVE mg/dL
HGB URINE DIPSTICK: NEGATIVE
KETONES UR: 15 mg/dL — AB
Leukocytes, UA: NEGATIVE
Nitrite: NEGATIVE
Protein, ur: NEGATIVE mg/dL
Specific Gravity, Urine: >1.046 — ABNORMAL HIGH (ref 1.005–1.030)
Urobilinogen, UA: 0.2 mg/dL (ref 0.0–1.0)
pH: 5.5 (ref 5.0–8.0)

## 2014-11-24 LAB — COMPREHENSIVE METABOLIC PANEL
ALT: 63 U/L — AB (ref 0–53)
AST: 68 U/L — AB (ref 0–37)
Albumin: 4.3 g/dL (ref 3.5–5.2)
Alkaline Phosphatase: 86 U/L (ref 39–117)
Anion gap: 22 — ABNORMAL HIGH (ref 5–15)
BUN: 9 mg/dL (ref 6–23)
CHLORIDE: 96 meq/L (ref 96–112)
CO2: 19 meq/L (ref 19–32)
CREATININE: 0.68 mg/dL (ref 0.50–1.35)
Calcium: 9.5 mg/dL (ref 8.4–10.5)
GFR calc Af Amer: >90 mL/min (ref >90)
Glucose, Bld: 165 mg/dL — ABNORMAL HIGH (ref 70–99)
Potassium: 4.5 mEq/L (ref 3.7–5.3)
Sodium: 137 mEq/L (ref 137–147)
Total Bilirubin: 1 mg/dL (ref 0.3–1.2)
Total Protein: 8.3 g/dL (ref 6.0–8.3)

## 2014-11-24 LAB — PHOSPHORUS: Phosphorus: 2.3 mg/dL (ref 2.3–4.6)

## 2014-11-24 LAB — TROPONIN I
Troponin I: <0.3 ng/mL (ref <0.30)
Troponin I: <0.3 ng/mL (ref <0.30)

## 2014-11-24 LAB — LIPASE, BLOOD: LIPASE: 72 U/L — AB (ref 11–59)

## 2014-11-24 LAB — LIPID PANEL
Cholesterol: 114 mg/dL (ref 0–200)
HDL: 32 mg/dL — ABNORMAL LOW (ref >39)
LDL CALC: UNDETERMINED mg/dL (ref 0–99)
TRIGLYCERIDES: 499 mg/dL — AB (ref <150)
Total CHOL/HDL Ratio: 3.6 RATIO
VLDL: UNDETERMINED mg/dL (ref 0–40)

## 2014-11-24 LAB — MRSA PCR SCREENING: MRSA BY PCR: NEGATIVE

## 2014-11-24 LAB — MAGNESIUM: Magnesium: 1.2 mg/dL — ABNORMAL LOW (ref 1.5–2.5)

## 2014-11-24 LAB — HIV ANTIBODY (ROUTINE TESTING W REFLEX): HIV 1&2 Ab, 4th Generation: NONREACTIVE

## 2014-11-24 LAB — RAPID URINE DRUG SCREEN, HOSP PERFORMED
Amphetamines: POSITIVE — AB
BENZODIAZEPINES: NOT DETECTED
Barbiturates: NOT DETECTED
COCAINE: NOT DETECTED
OPIATES: NOT DETECTED
TETRAHYDROCANNABINOL: NOT DETECTED

## 2014-11-24 LAB — LACTIC ACID, PLASMA: LACTIC ACID, VENOUS: 1.4 mmol/L (ref 0.5–2.2)

## 2014-11-24 LAB — HEMOGLOBIN A1C
Hgb A1c MFr Bld: 7 % — ABNORMAL HIGH (ref <5.7)
MEAN PLASMA GLUCOSE: 154 mg/dL — AB (ref <117)

## 2014-11-24 MED ORDER — LORAZEPAM 2 MG/ML IJ SOLN
1.0000 mg | Freq: Once | INTRAMUSCULAR | Status: AC
  Administered 2014-11-24: 1 mg via INTRAVENOUS
  Filled 2014-11-24: qty 1

## 2014-11-24 MED ORDER — SODIUM CHLORIDE 0.9 % IV BOLUS (SEPSIS)
1000.0000 mL | Freq: Once | INTRAVENOUS | Status: AC
  Administered 2014-11-24: 1000 mL via INTRAVENOUS

## 2014-11-24 MED ORDER — NICOTINE 14 MG/24HR TD PT24
14.0000 mg | MEDICATED_PATCH | Freq: Every day | TRANSDERMAL | Status: DC
  Administered 2014-11-24 – 2014-11-29 (×6): 14 mg via TRANSDERMAL
  Filled 2014-11-24 (×6): qty 1

## 2014-11-24 MED ORDER — IOHEXOL 300 MG/ML  SOLN
100.0000 mL | Freq: Once | INTRAMUSCULAR | Status: AC | PRN
  Administered 2014-11-24: 100 mL via INTRAVENOUS

## 2014-11-24 MED ORDER — HYDROMORPHONE HCL 1 MG/ML IJ SOLN
1.0000 mg | Freq: Once | INTRAMUSCULAR | Status: AC
  Administered 2014-11-24: 1 mg via INTRAVENOUS
  Filled 2014-11-24: qty 1

## 2014-11-24 MED ORDER — MAGNESIUM SULFATE 2 GM/50ML IV SOLN
2.0000 g | Freq: Once | INTRAVENOUS | Status: AC
  Administered 2014-11-24: 2 g via INTRAVENOUS
  Filled 2014-11-24: qty 50

## 2014-11-24 MED ORDER — ONDANSETRON HCL 4 MG/2ML IJ SOLN: 4.0000 mg | Freq: Four times a day (QID) | INTRAMUSCULAR | Status: DC | PRN

## 2014-11-24 MED ORDER — ADULT MULTIVITAMIN W/MINERALS CH
1.0000 | ORAL_TABLET | Freq: Every day | ORAL | Status: DC
  Administered 2014-11-24 – 2014-11-29 (×4): 1 via ORAL
  Filled 2014-11-24 (×6): qty 1

## 2014-11-24 MED ORDER — LEVETIRACETAM IN NACL 500 MG/100ML IV SOLN
500.0000 mg | Freq: Two times a day (BID) | INTRAVENOUS | Status: DC
Start: 2014-11-24 — End: 2014-11-28
  Administered 2014-11-24 – 2014-11-28 (×8): 500 mg via INTRAVENOUS
  Filled 2014-11-24 (×9): qty 100

## 2014-11-24 MED ORDER — HYDROMORPHONE HCL 1 MG/ML IJ SOLN
1.0000 mg | INTRAMUSCULAR | Status: DC | PRN
  Administered 2014-11-24 – 2014-11-25 (×9): 1 mg via INTRAVENOUS
  Filled 2014-11-24 (×9): qty 1

## 2014-11-24 MED ORDER — ONDANSETRON HCL 4 MG/2ML IJ SOLN
4.0000 mg | Freq: Once | INTRAMUSCULAR | Status: AC
  Administered 2014-11-24: 4 mg via INTRAVENOUS
  Filled 2014-11-24: qty 2

## 2014-11-24 MED ORDER — LABETALOL HCL 5 MG/ML IV SOLN
10.0000 mg | Freq: Once | INTRAVENOUS | Status: AC
  Administered 2014-11-24: 10 mg via INTRAVENOUS
  Filled 2014-11-24: qty 4

## 2014-11-24 MED ORDER — LORAZEPAM 2 MG/ML IJ SOLN
1.0000 mg | Freq: Four times a day (QID) | INTRAMUSCULAR | Status: AC | PRN
  Administered 2014-11-24 – 2014-11-26 (×4): 1 mg via INTRAVENOUS
  Filled 2014-11-24 (×4): qty 1

## 2014-11-24 MED ORDER — THIAMINE HCL 100 MG/ML IJ SOLN: 100.0000 mg | Freq: Every day | INTRAMUSCULAR | Status: DC

## 2014-11-24 MED ORDER — HEPARIN SODIUM (PORCINE) 5000 UNIT/ML IJ SOLN
5000.0000 [IU] | Freq: Three times a day (TID) | INTRAMUSCULAR | Status: DC
  Administered 2014-11-24 – 2014-11-29 (×15): 5000 [IU] via SUBCUTANEOUS
  Filled 2014-11-24 (×21): qty 1

## 2014-11-24 MED ORDER — HYDRALAZINE HCL 20 MG/ML IJ SOLN
10.0000 mg | Freq: Once | INTRAMUSCULAR | Status: AC
  Administered 2014-11-24: 10 mg via INTRAVENOUS
  Filled 2014-11-24: qty 1

## 2014-11-24 MED ORDER — CLONIDINE HCL 0.1 MG PO TABS
0.1000 mg | ORAL_TABLET | Freq: Once | ORAL | Status: AC
  Administered 2014-11-24: 0.1 mg via ORAL
  Filled 2014-11-24: qty 1

## 2014-11-24 MED ORDER — VITAMIN B-1 100 MG PO TABS
100.0000 mg | ORAL_TABLET | Freq: Every day | ORAL | Status: DC
  Administered 2014-11-24 – 2014-11-29 (×3): 100 mg via ORAL
  Filled 2014-11-24 (×6): qty 1

## 2014-11-24 MED ORDER — IOHEXOL 300 MG/ML  SOLN
25.0000 mL | Freq: Once | INTRAMUSCULAR | Status: AC | PRN
  Administered 2014-11-24: 25 mL via ORAL

## 2014-11-24 MED ORDER — PANTOPRAZOLE SODIUM 40 MG IV SOLR
40.0000 mg | Freq: Once | INTRAVENOUS | Status: AC
Start: 2014-11-24 — End: 2014-11-24
  Administered 2014-11-24: 40 mg via INTRAVENOUS
  Filled 2014-11-24: qty 40

## 2014-11-24 MED ORDER — PANTOPRAZOLE SODIUM 40 MG IV SOLR
40.0000 mg | Freq: Two times a day (BID) | INTRAVENOUS | Status: DC
  Administered 2014-11-24 – 2014-11-28 (×9): 40 mg via INTRAVENOUS
  Filled 2014-11-24 (×9): qty 40

## 2014-11-24 MED ORDER — FOLIC ACID 5 MG/ML IJ SOLN
1.0000 mg | Freq: Every day | INTRAMUSCULAR | Status: DC
  Administered 2014-11-24 – 2014-11-28 (×5): 1 mg via INTRAVENOUS
  Filled 2014-11-24 (×5): qty 0.2

## 2014-11-24 MED ORDER — THIAMINE HCL 100 MG/ML IJ SOLN
100.0000 mg | Freq: Every day | INTRAMUSCULAR | Status: DC
  Administered 2014-11-25 – 2014-11-27 (×3): 100 mg via INTRAVENOUS
  Filled 2014-11-24 (×5): qty 1

## 2014-11-24 MED ORDER — LORAZEPAM 1 MG PO TABS: 1.0000 mg | ORAL_TABLET | Freq: Four times a day (QID) | ORAL | Status: AC | PRN

## 2014-11-24 MED ORDER — ONDANSETRON HCL 4 MG PO TABS: 4.0000 mg | ORAL_TABLET | Freq: Four times a day (QID) | ORAL | Status: DC | PRN

## 2014-11-24 MED ORDER — FOLIC ACID 1 MG PO TABS: 1.0000 mg | ORAL_TABLET | Freq: Every day | ORAL | Status: DC

## 2014-11-24 MED ORDER — SODIUM CHLORIDE 0.9 % IV SOLN
INTRAVENOUS | Status: DC
  Administered 2014-11-24 – 2014-11-25 (×2): via INTRAVENOUS

## 2014-11-24 NOTE — ED Notes (Signed)
Pt reports lower right quadrant pain that started last night. Pt states he had an episode like this before and it lasted for about a week and went away. Pt reports nausea and vomiting, states he vomited 6 times. Pt reports taking motrin, goody powder, pepto bismol, and antiacid for sx with no relief. Pt rates pain 10/10.

## 2014-11-24 NOTE — ED Provider Notes (Signed)
CSN: [id number]     Arrival date & time 11/24/14  0608 History   First MD Initiated Contact with Patient 11/24/14 [phone]     Chief Complaint  Patient presents with  . Abdominal Pain  . Chest Pain     (Consider location/radiation/quality/duration/timing/severity/associated sxs/prior Treatment) HPI Comments: Patient with a history of TBI, Anxiety, Depression, and Alcoholism presents today with a chief complaint of RLQ abdominal pain.  He reports that the pain has been present for the past 2 days.  Pain has been constant and is gradually worsening.  He reports that last evening he began vomiting.  He states that he has had 6-8 episodes of vomiting since last night.  He reports that the abdominal pain radiates to the RUQ of his abdomen and his right lower chest.  Chest pain is worse with palpation.   He has taken [employer], an Antacid, and Ibuprofen for his pain without relief.  He denies diarrhea, constipation, cough, fever, chills, or SOB.  He denies any prior abdominal surgeries.  Denies recreational drug use.  He reports that he does drink 4 tall cans of beer daily.  Last drink was yesterday.  The history is provided by the patient.    Past Medical History  Diagnosis Date  . Suicide   . Depression   . Anxiety   . Depression     treated at [facility]  . Multiple personality disorder 2014  . Neuropathy     pt states he has been diagnosed with a nerve problem in bilateral legs d/t cutting years ago   . TBI (traumatic brain injury)    Past Surgical History  Procedure Laterality Date  . No past surgeries     Family History  Problem Relation Age of Onset  . Alcoholism Mother   . Diabetes Mellitus II Mother   . Alcoholism Father   . Diabetes Mellitus II Father    History  Substance Use Topics  . Smoking status: Current Every Day Smoker  . Smokeless tobacco: Never Used  . Alcohol Use: Yes     Comment: drinks a fith a day    Review of Systems  All other systems reviewed and are  negative.     Allergies  Review of patient's allergies indicates no known allergies.  Home Medications   Prior to Admission medications   Medication Sig Start Date End Date Taking? Authorizing Provider  Aspirin-Salicylamide-Caffeine (BC HEADACHE POWDER PO) Take 1 packet by mouth every 6 (six) hours as needed (for pain.).    Historical Provider, MD  chlordiazePOXIDE (LIBRIUM) 5 MG capsule Please dispense 18 pills - Take 1 pill three times a day for 3 days, then Take 1 pill two times a day for 3 days, then Take 1 pill once a day for 3 days and stop. 09/05/14   Thurnell Lose, MD  cloNIDine (CATAPRES) 0.1 MG tablet Take 1 tablet (0.1 mg total) by mouth 2 (two) times daily. 09/05/14   Thurnell Lose, MD  folic acid (FOLVITE) 1 MG tablet Take 1 tablet (1 mg total) by mouth daily. 09/05/14   Thurnell Lose, MD  glipiZIDE (GLUCOTROL) 2.5 mg TABS tablet Take 0.5 tablets (2.5 mg total) by mouth daily before breakfast. 09/05/14   Thurnell Lose, MD  glucose monitoring kit (FREESTYLE) monitoring kit 1 each by Does not apply route 4 (four) times daily - after meals and at bedtime. 1 month Diabetic Testing Supplies for QAC-QHS accuchecks.Any brand OK 09/05/14   Prashant K  Candiss Norse, MD  levETIRAcetam (KEPPRA) 500 MG tablet Take 1 tablet (500 mg total) by mouth 2 (two) times daily. 09/05/14   Thurnell Lose, MD  lisinopril (PRINIVIL,ZESTRIL) 5 MG tablet Take 1 tablet (5 mg total) by mouth daily. 09/05/14   Thurnell Lose, MD  nicotine (NICODERM CQ - DOSED IN MG/24 HOURS) 14 mg/24hr patch Place 1 patch (14 mg total) onto the skin daily. 09/05/14   Thurnell Lose, MD  QUEtiapine (SEROQUEL XR) 200 MG 24 hr tablet Take 200 mg by mouth at bedtime.    Historical Provider, MD  thiamine (VITAMIN B-1) 100 MG tablet Take 1 tablet (100 mg total) by mouth daily. 09/05/14   Thurnell Lose, MD   BP 173/126 mmHg  Pulse 119  Temp(Src) 98.5 F (36.9 C) (Oral)  Resp 15  SpO2 99% Physical Exam  Constitutional: He  appears well-developed and well-nourished.  HENT:  Head: Normocephalic and atraumatic.  Mouth/Throat: Oropharynx is clear and moist.  Neck: Normal range of motion. Neck supple.  Cardiovascular: Regular rhythm and normal heart sounds.  Tachycardia present.   Pulmonary/Chest: Effort normal and breath sounds normal. No respiratory distress. He has no wheezes. He has no rales. He exhibits tenderness.  Tenderness to palpation of the right lower chest wall  Abdominal: Soft. Bowel sounds are normal. He exhibits no distension and no mass. There is tenderness in the right lower quadrant. There is no rebound and no guarding.  Musculoskeletal: Normal range of motion.  Neurological: He is alert.  Skin: Skin is warm and dry.  Psychiatric: He has a normal mood and affect.  Nursing note and vitals reviewed.   ED Course  Procedures (including critical care time) Labs Review Labs Reviewed  BASIC METABOLIC PANEL  CBC WITH DIFFERENTIAL  LIPASE, BLOOD    Imaging Review Dg Chest 2 View  11/24/2014   CLINICAL DATA:  28 year old male with right lower quadrant abdominal pain for the past 3 days  EXAM: CHEST  2 VIEW  COMPARISON:  Prior chest x-ray and chest CT 09/03/2014  FINDINGS: The lungs are clear and negative for focal airspace consolidation, pulmonary edema or suspicious pulmonary nodule. No pleural effusion or pneumothorax. Cardiac and mediastinal contours are within normal limits. No acute fracture or lytic or blastic osseous lesions. The visualized upper abdominal bowel gas pattern is unremarkable.  IMPRESSION: Negative chest x-ray.   Electronically Signed   By: Jacqulynn Cadet M.D.   On: 11/24/2014 07:19   Ct Abdomen Pelvis W Contrast  11/24/2014   CLINICAL DATA:  28 year old male with 1 day history of right lower quadrant pain accompanied by nausea, vomiting and chills  EXAM: CT ABDOMEN AND PELVIS WITH CONTRAST  TECHNIQUE: Multidetector CT imaging of the abdomen and pelvis was performed using the  standard protocol following bolus administration of intravenous contrast.  CONTRAST:  124m OMNIPAQUE IOHEXOL 300 MG/ML  SOLN  COMPARISON:  Prior CT scan of the chest 09/03/2014  FINDINGS: Lower Chest: The lung bases are clear. Visualized cardiac structures are within normal limits for size. No pericardial effusion. Unremarkable visualized distal thoracic esophagus.  Abdomen: Unremarkable CT appearance of the stomach, spleen, and adrenal glands. Edematous and ill-defined pancreatic head and uncinate process. Hypo attenuation of the medial uncinate process is present. There is extensive inflammatory stranding in the peripancreatic soft tissues. Secondary thickening of the adjacent descending duodenum is also present. Inflammatory fluid tracks inferiorly along the duodenum sweep, along the mesenteric vessels into the root of the mesentery and laterally along  the right anterior para renal fascia into the right lower quadrant. Mildly prominent but not enlarged by CT criteria peripancreatic and mesenteric lymph nodes. A hepatoduodenal ligament node is enlarged at 1.8 cm in short axis. A gastrohepatic ligament lymph node is also enlarged at 1.9 cm in short axis.  Normal hepatic contour and morphology. Gallbladder is unremarkable. No intra or extrahepatic biliary ductal dilatation. Portal veins remain patent.  Unremarkable appearance of the bilateral kidneys. No focal solid lesion, hydronephrosis or nephrolithiasis. No evidence of a bowel obstruction or focal bowel wall thickening other than the reactive thickening of the descending duodenum. Normal appendix in the right lower quadrant.  Pelvis: Unremarkable bladder, prostate gland and seminal vesicles. No free fluid or suspicious adenopathy.  Bones/Soft Tissues: No acute fracture or aggressive appearing lytic or blastic osseous lesion.  Vascular: No significant atherosclerotic vascular disease, aneurysmal dilatation or acute abnormality.  IMPRESSION: 1. CT findings are  most consistent with acute pancreatitis involving the head and uncinate process of the pancreas with secondary reactive duodenitis. Inflammatory stranding and fluid tracks inferiorly into the root of the mesentery and toward the right lower quadrant along the right anterior para renal fascia. A primary duodenitis with reactive changes in the pancreas is considered a less likely possibility. No pseudocyst or vascular complication at this time although inflammatory stranding in does track along the SMA and SMV into the root of the mesentery. 2. Enlarged upper abdominal (gastrohepatic and hepatoduodenal) lymph nodes measuring nearly 2 cm in short axis. Given the patient's age and the location, this adenopathy is favored to be reactive to the underlying pancreaticoduodenal process. However, it would be difficult to entirely exclude the possibility of a lymphoproliferative process such as lymphoma. Short interval followup is warranted to confirm stability/regression. Recommend repeat CT scan of the abdomen with contrast in 2-3 months. 3. Normal appendix in the right lower quadrant.   Electronically Signed   By: Jacqulynn Cadet M.D.   On: 11/24/2014 08:41     EKG Interpretation None      7:40 AM Reassessed patient.  He reports that he is still having significant pain, but nausea has improved.  Will order another dose of pain medication and reassess. 9:00 AM Reassessed patient.  He reports that pain has not improved.  Will order another dose of pain medication and reassess.  Abdomen soft with tenderness to palpation of the RLQ and RUQ.  No rebound or guarding.  10:14 AM Discussed the patient with Internal Medicine teaching service.  They report that they will come see the patient in the ED MDM   Final diagnoses:  None   Patient with a history of Alcoholism presents today with RLQ abdominal pain.  Pain radiates to the RUQ and right lower chest.  Pain associated with nausea and vomiting.  Labs show a  very mildly elevated Lipase of 72, elevated LFT's, and Leukocytosis.  CT abdomen and pelvis showing Pancreatitis.  Patient also found to be Tachycardic and Hypertensive.  Reviewed chart and patient has been tachycardic in the past.  Patient given IVF, Clonidine, Ativan, and Labetalol in the ED without improvement.  Patient with normal Creatine.  No evidence of end organ damage.  Due to the fact that pain not controlled after three doses of Dilaudid, the patient was admitted to Internal Medicine teaching service for further management.    Hyman Bible, PA-C 11/24/14 1119  Julianne Rice, MD 11/24/14 [phone]
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
## 2014-11-25 ENCOUNTER — Observation Stay (HOSPITAL_COMMUNITY): Payer: Self-pay

## 2014-11-25 DIAGNOSIS — R1013 Epigastric pain: Secondary | ICD-10-CM

## 2014-11-25 DIAGNOSIS — R079 Chest pain, unspecified: Secondary | ICD-10-CM

## 2014-11-25 LAB — CBC
HCT: 39.8 % (ref 39.0–52.0)
Hemoglobin: 14 g/dL (ref 13.0–17.0)
MCH: 33.9 pg (ref 26.0–34.0)
MCHC: 35.2 g/dL (ref 30.0–36.0)
MCV: 96.4 fL (ref 78.0–100.0)
PLATELETS: 125 10*3/uL — AB (ref 150–400)
RBC: 4.13 MIL/uL — AB (ref 4.22–5.81)
RDW: 13.7 % (ref 11.5–15.5)
WBC: 12.1 10*3/uL — AB (ref 4.0–10.5)

## 2014-11-25 LAB — COMPREHENSIVE METABOLIC PANEL
ALT: 65 U/L — ABNORMAL HIGH (ref 0–53)
ANION GAP: 16 — AB (ref 5–15)
AST: 91 U/L — AB (ref 0–37)
Albumin: 3.2 g/dL — ABNORMAL LOW (ref 3.5–5.2)
Alkaline Phosphatase: 124 U/L — ABNORMAL HIGH (ref 39–117)
BILIRUBIN TOTAL: 2.2 mg/dL — AB (ref 0.3–1.2)
BUN: 4 mg/dL — AB (ref 6–23)
CALCIUM: 8.6 mg/dL (ref 8.4–10.5)
CHLORIDE: 95 meq/L — AB (ref 96–112)
CO2: 19 mEq/L (ref 19–32)
CREATININE: 0.59 mg/dL (ref 0.50–1.35)
GFR calc Af Amer: >90 mL/min (ref >90)
Glucose, Bld: 123 mg/dL — ABNORMAL HIGH (ref 70–99)
Potassium: 3.7 mEq/L (ref 3.7–5.3)
Sodium: 130 mEq/L — ABNORMAL LOW (ref 137–147)
Total Protein: 7.1 g/dL (ref 6.0–8.3)

## 2014-11-25 LAB — LIPID PANEL
Cholesterol: 127 mg/dL (ref 0–200)
HDL: 38 mg/dL — AB (ref >39)
LDL Cholesterol: 11 mg/dL (ref 0–99)
Total CHOL/HDL Ratio: 3.3 RATIO
Triglycerides: 390 mg/dL — ABNORMAL HIGH (ref <150)
VLDL: 78 mg/dL — ABNORMAL HIGH (ref 0–40)

## 2014-11-25 LAB — BILIRUBIN, FRACTIONATED(TOT/DIR/INDIR)
BILIRUBIN DIRECT: 0.9 mg/dL — AB (ref 0.0–0.3)
BILIRUBIN INDIRECT: 1.4 mg/dL — AB (ref 0.3–0.9)
Total Bilirubin: 2.3 mg/dL — ABNORMAL HIGH (ref 0.3–1.2)

## 2014-11-25 LAB — TROPONIN I

## 2014-11-25 LAB — GLUCOSE, CAPILLARY
GLUCOSE-CAPILLARY: 117 mg/dL — AB (ref 70–99)
Glucose-Capillary: 136 mg/dL — ABNORMAL HIGH (ref 70–99)
Glucose-Capillary: 118 mg/dL — ABNORMAL HIGH (ref 70–99)

## 2014-11-25 LAB — MAGNESIUM: MAGNESIUM: 1.8 mg/dL (ref 1.5–2.5)

## 2014-11-25 MED ORDER — QUETIAPINE FUMARATE ER 200 MG PO TB24
200.0000 mg | ORAL_TABLET | Freq: Every day | ORAL | Status: DC
  Administered 2014-11-25 – 2014-11-28 (×4): 200 mg via ORAL
  Filled 2014-11-25 (×6): qty 1

## 2014-11-25 MED ORDER — LORAZEPAM 2 MG/ML IJ SOLN
1.0000 mg | Freq: Once | INTRAMUSCULAR | Status: AC
  Administered 2014-11-25: 1 mg via INTRAVENOUS
  Filled 2014-11-25: qty 1

## 2014-11-25 MED ORDER — SODIUM CHLORIDE 0.9 % IV SOLN
INTRAVENOUS | Status: AC
  Administered 2014-11-25 – 2014-11-26 (×3): via INTRAVENOUS

## 2014-11-25 MED ORDER — MORPHINE SULFATE 2 MG/ML IJ SOLN: 2.0000 mg | INTRAMUSCULAR | Status: DC | PRN

## 2014-11-25 MED ORDER — MORPHINE SULFATE 2 MG/ML IJ SOLN
2.0000 mg | INTRAMUSCULAR | Status: DC | PRN
  Administered 2014-11-25 – 2014-11-26 (×4): 2 mg via INTRAVENOUS
  Filled 2014-11-25 (×4): qty 1

## 2014-11-25 MED ORDER — HYDRALAZINE HCL 20 MG/ML IJ SOLN
10.0000 mg | Freq: Three times a day (TID) | INTRAMUSCULAR | Status: DC | PRN
  Administered 2014-11-25: 10 mg via INTRAVENOUS
  Filled 2014-11-25: qty 1

## 2014-11-25 NOTE — Progress Notes (Signed)
Utilization Review Completed.   Jazyiah Yiu, RN, BSN Nurse Case Manager  

## 2014-11-25 NOTE — Progress Notes (Addendum)
Subjective: Feels better today, pain is better but still has RLQ ab pain with radiation to epigastric region, to the back. Pain is worse with moving around. Also ha some chest pain that he feels when he moves around in bed. Patient keeps denying any drug use. No longer having nausea or vomiting. Wants to eat something but I told him his pain needs to be improving before we can let him eat. No fever/chills/sob.   BP remains high. Has been getting dilaudid for pain.   Objective: Vital signs in last 24 hours: Filed Vitals:   11/24/14 1913 11/24/14 2336 11/25/14 0349 11/25/14 0435  BP: 167/119 152/98 158/118   Pulse: 130 115 118   Temp: 98.9 F (37.2 C) 99.8 F (37.7 C) 100 F (37.8 C)   TempSrc: Oral Oral Oral   Resp: _0 Height:      Weight:    202 lb 13.2 oz (92 kg)  SpO2: 98% 96% 97%    Weight change:   Intake/Output Summary (Last 24 hours) at 11/25/14 0700 Last data filed at 11/25/14 0600  Gross per 24 hour  Intake 2742.5 ml  Output    702 ml  Net 2040.5 ml   Physical Exam   Vitals reviewed. General: appears to be anxious, sitting in bed. HEENT: PERRL, EOMI, no scleral icterus Cardiac: tachycardic, regular rhythm, no rubs, murmurs or gallops Pulm: clear to auscultation bilaterally, no wheezes, rales, or rhonchi Abd: soft, mild TTP on RLQ, epigastric region. Ext: warm and well perfused, no pedal edema Neuro: alert and oriented X3, cranial nerves II-XII grossly intact, strength and sensation to light touch equal in bilateral upper and lower extremities. Has mild tremors, better than yesterday.   Lab Results: Basic Metabolic Panel:  Recent Labs Lab 11/24/14 0643 11/24/14 1038 11/25/14 0323  NA 137  --  130*  K 4.5  --  3.7  CL 96  --  95*  CO2 19  --  19  GLUCOSE 165*  --  123*  BUN 9  --  4*  CREATININE 0.68  --  0.59  CALCIUM 9.5  --  8.6  MG  --  1.2* 1.8  PHOS  --  2.3  --    Liver Function Tests:  Recent Labs Lab 11/24/14 0643  11/25/14 0323  AST 68* 91*  ALT 63* 65*  ALKPHOS 86 124*  BILITOT 1.0 2.2*  PROT 8.3 7.1  ALBUMIN 4.3 3.2*    Recent Labs Lab 11/24/14 0643  LIPASE 72*   No results for input(s): AMMONIA in the last 168 hours. CBC:  Recent Labs Lab 11/24/14 0643 11/25/14 0323  WBC 17.8* 12.1*  NEUTROABS 14.2*  --   HGB 17.0 14.0  HCT 46.7 39.8  MCV 92.5 96.4  PLT 186 125*   Cardiac Enzymes:  Recent Labs Lab 11/24/14 1646 11/24/14 2203 11/25/14 0323  TROPONINI <0.30 <0.30 <0.30   BNP: No results for input(s): PROBNP in the last 168 hours. D-Dimer: No results for input(s): DDIMER in the last 168 hours. CBG: No results for input(s): GLUCAP in the last 168 hours. Hemoglobin A1C:  Recent Labs Lab 11/24/14 1038  HGBA1C 7.0*   Fasting Lipid Panel:  Recent Labs Lab 11/24/14 1038 11/24/14 1159  CHOL 114  --   HDL 32*  --   LDLCALC UNABLE TO CALCULATE IF TRIGLYCERIDE OVER 400 mg/dL  --   TRIG 499*  --   CHOLHDL 3.6  --   LDLDIRECT  --  37   Thyroid Function Tests: No results for input(s): TSH, T4TOTAL, FREET4, T3FREE, THYROIDAB in the last 168 hours. Coagulation: No results for input(s): LABPROT, INR in the last 168 hours. Anemia Panel: No results for input(s): VITAMINB12, FOLATE, FERRITIN, TIBC, IRON, RETICCTPCT in the last 168 hours. Urine Drug Screen: Drugs of Abuse     Component Value Date/Time   LABOPIA NONE DETECTED 11/24/2014 0847   COCAINSCRNUR NONE DETECTED 11/24/2014 0847   LABBENZ NONE DETECTED 11/24/2014 0847   AMPHETMU POSITIVE* 11/24/2014 0847   THCU NONE DETECTED 11/24/2014 0847   LABBARB NONE DETECTED 11/24/2014 0847    Alcohol Level: No results for input(s): ETH in the last 168 hours. Urinalysis:  Recent Labs Lab 11/24/14 0847  COLORURINE YELLOW  LABSPEC >1.046*  PHURINE 5.5  GLUCOSEU NEGATIVE  HGBUR NEGATIVE  BILIRUBINUR NEGATIVE  KETONESUR 15*  PROTEINUR NEGATIVE  UROBILINOGEN 0.2  NITRITE NEGATIVE  LEUKOCYTESUR NEGATIVE    Misc. Labs:  Micro Results: Recent Results (from the past 240 hour(s))  MRSA PCR Screening     Status: None   Collection Time: 11/24/14  3:36 PM  Result Value Ref Range Status   MRSA by PCR NEGATIVE NEGATIVE Final    Comment:        The GeneXpert MRSA Assay (FDA approved for NASAL specimens only), is one component of a comprehensive MRSA colonization surveillance program. It is not intended to diagnose MRSA infection nor to guide or monitor treatment for MRSA infections.    Studies/Results: Dg Chest 2 View  11/24/2014   CLINICAL DATA:  28 year old male with right lower quadrant abdominal pain for the past 3 days  EXAM: CHEST  2 VIEW  COMPARISON:  Prior chest x-ray and chest CT 09/03/2014  FINDINGS: The lungs are clear and negative for focal airspace consolidation, pulmonary edema or suspicious pulmonary nodule. No pleural effusion or pneumothorax. Cardiac and mediastinal contours are within normal limits. No acute fracture or lytic or blastic osseous lesions. The visualized upper abdominal bowel gas pattern is unremarkable.  IMPRESSION: Negative chest x-ray.   Electronically Signed   By: Jacqulynn Cadet M.D.   On: 11/24/2014 07:19   Ct Abdomen Pelvis W Contrast  11/24/2014   CLINICAL DATA:  28 year old male with 1 day history of right lower quadrant pain accompanied by nausea, vomiting and chills  EXAM: CT ABDOMEN AND PELVIS WITH CONTRAST  TECHNIQUE: Multidetector CT imaging of the abdomen and pelvis was performed using the standard protocol following bolus administration of intravenous contrast.  CONTRAST:  184m OMNIPAQUE IOHEXOL 300 MG/ML  SOLN  COMPARISON:  Prior CT scan of the chest 09/03/2014  FINDINGS: Lower Chest: The lung bases are clear. Visualized cardiac structures are within normal limits for size. No pericardial effusion. Unremarkable visualized distal thoracic esophagus.  Abdomen: Unremarkable CT appearance of the stomach, spleen, and adrenal glands. Edematous and  ill-defined pancreatic head and uncinate process. Hypo attenuation of the medial uncinate process is present. There is extensive inflammatory stranding in the peripancreatic soft tissues. Secondary thickening of the adjacent descending duodenum is also present. Inflammatory fluid tracks inferiorly along the duodenum sweep, along the mesenteric vessels into the root of the mesentery and laterally along the right anterior para renal fascia into the right lower quadrant. Mildly prominent but not enlarged by CT criteria peripancreatic and mesenteric lymph nodes. A hepatoduodenal ligament node is enlarged at 1.8 cm in short axis. A gastrohepatic ligament lymph node is also enlarged at 1.9 cm in short axis.  Normal hepatic contour and  morphology. Gallbladder is unremarkable. No intra or extrahepatic biliary ductal dilatation. Portal veins remain patent.  Unremarkable appearance of the bilateral kidneys. No focal solid lesion, hydronephrosis or nephrolithiasis. No evidence of a bowel obstruction or focal bowel wall thickening other than the reactive thickening of the descending duodenum. Normal appendix in the right lower quadrant.  Pelvis: Unremarkable bladder, prostate gland and seminal vesicles. No free fluid or suspicious adenopathy.  Bones/Soft Tissues: No acute fracture or aggressive appearing lytic or blastic osseous lesion.  Vascular: No significant atherosclerotic vascular disease, aneurysmal dilatation or acute abnormality.  IMPRESSION: 1. CT findings are most consistent with acute pancreatitis involving the head and uncinate process of the pancreas with secondary reactive duodenitis. Inflammatory stranding and fluid tracks inferiorly into the root of the mesentery and toward the right lower quadrant along the right anterior para renal fascia. A primary duodenitis with reactive changes in the pancreas is considered a less likely possibility. No pseudocyst or vascular complication at this time although  inflammatory stranding in does track along the SMA and SMV into the root of the mesentery. 2. Enlarged upper abdominal (gastrohepatic and hepatoduodenal) lymph nodes measuring nearly 2 cm in short axis. Given the patient's age and the location, this adenopathy is favored to be reactive to the underlying pancreaticoduodenal process. However, it would be difficult to entirely exclude the possibility of a lymphoproliferative process such as lymphoma. Short interval followup is warranted to confirm stability/regression. Recommend repeat CT scan of the abdomen with contrast in 2-3 months. 3. Normal appendix in the right lower quadrant.   Electronically Signed   By: Jacqulynn Cadet M.D.   On: 11/24/2014 08:41   Medications: I have reviewed the patient's current medications. Scheduled Meds: . folic acid  1 mg Intravenous Daily  . heparin  5,000 Units Subcutaneous 3 times per day  . levETIRAcetam  500 mg Intravenous Q12H  . multivitamin with minerals  1 tablet Oral Daily  . nicotine  14 mg Transdermal Daily  . pantoprazole (PROTONIX) IV  40 mg Intravenous Q12H  . thiamine  100 mg Oral Daily   Or  . thiamine  100 mg Intravenous Daily   Continuous Infusions: . sodium chloride 150 mL/hr at 11/25/14 0600   PRN Meds:.HYDROmorphone (DILAUDID) injection, LORazepam **OR** LORazepam, ondansetron **OR** ondansetron (ZOFRAN) IV Assessment/Plan: Active Problems:   Diabetes mellitus   Alcohol abuse   Seizure disorder   Acute pancreatitis   Uncontrolled hypertension   Depression with anxiety  28 yo male with hx DM II, HTN, multiple psych disorders, seizure hx, medication nocompliance, polysubstance abuse including alcohol abuse here with RLQ ab pain, CT finding of acute pancreatitis.  Acute pancreatitis - likely 2/2 to alcohol abuse but his TGA is also 499. No gallbladder wall thickening or gall stones seen on CT, and also normal appendix. CT did show gastrohepatic and hepatoduodenal lymphadenopathy, also  duodenitis, likely reactive to pancreatitis. Lipase only 72. No pseudocysts. -kept NPO, IVF bolused 1L in ED and now getting 171m/hr. Keep NPO until pain improves.  - pain mildly improved on dilaudid. Will switch him to Morphine 294mIV q3hr. Can increase based on his response. -  Tbili increased to 2.2 today, alk phos 124 (increased but could be a acute phase reactant), also AST/ALT slightly increased. Will get RUQ ab u/s to rule out any cholecystitis or gall stones. - encouraged quitting alcohol abuse. - will start him on fibrate for his hypertriglycerdiemia.  Chest pain/epigastric pain - likely from the pancreatitis or GERD. Only  happens when moving around. Could have gastritis/PUD as well. Continuing protonix IV. Will send him with po protonix on discharge. trops negative. No ekg changes.  Hypertension - was in 412-820 systolic and 813-887 diastolic. Received hydralazine 45m once. Remains high today. Will start him on q8hr PRN hydral for BP >180/110's. HTN could be from not taking his meds and also alcohol withdrawal and pain. -when not NPO, will restart his lisinopril. Will not give him clonidine since it's not first line for HTN and he is not very compliant so he is at risk of rebound HTN more than others. - gave him extra ativan dose to help calm him down and hopefully his BP will improve.  Polysubstance abuse - significant alcohol abuse hx (drinks at least 4 tall beers a day), hx of heroin, methamphetamines, cocaine abuse and needle sharing.  -encouraged quitting. Consulted SW to help him with community resources - CIWA protocol. Banana bag (IV).  Anion gap met acidosis - improving- likley 2/2 to alcohol ketoacidosis, UA showed ketones. Lactic acid normal - monitor.  Hematemesis - resolved. Likely 2/2 to gastritis or PUD since has hx of alcohol abuse and also BC powder usage.  Hep C - known Hep C, viral log load 6.38. Doesn't follow with ID. - HIV negative. [facility] and may need ID follow up outpatient.  DM II - hgba1c 7.9 08/2014, now 7.0. On glipizide but ran out. - SSI here. Will put him back on glipizide on discharge  Hx of seizures - was taking keppra only as needed. Explained this is not a PRN med. On keppra 5086mBID IV now, switch to PO when able.  Depression/anxiety/suicidal - follows with [facility]. Takes seroquel. Restart it when not NPO.. Marland KitchenDispo: Disposition is deferred at this time, awaiting improvement of current medical problems.  Anticipated discharge in approximately 1-2 day(s).   The patient does not have a current PCP (No Pcp Per Patient) and [facility].  The patient does have transportation limitations that hinder transportation to clinic appointments.  .Services Needed at time of discharge: Y = Yes, Blank = No PT:   OT:   RN:   Equipment:   Other:     LOS: 1 day   TaDellia NimsMD 11/25/2014, 7:00 AM

## 2014-11-25 NOTE — Progress Notes (Signed)
Pt transferred to 6E 01 from 3S Tele 01 S-tach 121, BP 149/102. Denies pain. Pleasant.

## 2014-11-25 NOTE — Progress Notes (Signed)
Report given to Inetta Fermoina, RN on [facility].

## 2014-11-26 LAB — COMPREHENSIVE METABOLIC PANEL
ALT: 34 U/L (ref 0–53)
AST: 26 U/L (ref 0–37)
Albumin: 2.8 g/dL — ABNORMAL LOW (ref 3.5–5.2)
Alkaline Phosphatase: 88 U/L (ref 39–117)
Anion gap: 18 — ABNORMAL HIGH (ref 5–15)
BUN: 4 mg/dL — ABNORMAL LOW (ref 6–23)
CO2: 17 meq/L — AB (ref 19–32)
CREATININE: 0.6 mg/dL (ref 0.50–1.35)
Calcium: 8.5 mg/dL (ref 8.4–10.5)
Chloride: 96 mEq/L (ref 96–112)
GFR calc Af Amer: >90 mL/min (ref >90)
Glucose, Bld: 88 mg/dL (ref 70–99)
Potassium: 3.5 mEq/L — ABNORMAL LOW (ref 3.7–5.3)
SODIUM: 131 meq/L — AB (ref 137–147)
TOTAL PROTEIN: 6.8 g/dL (ref 6.0–8.3)
Total Bilirubin: 1 mg/dL (ref 0.3–1.2)

## 2014-11-26 LAB — CBC WITH DIFFERENTIAL/PLATELET
Basophils Absolute: 0 10*3/uL (ref 0.0–0.1)
Basophils Relative: 0 % (ref 0–1)
Eosinophils Absolute: 0.2 10*3/uL (ref 0.0–0.7)
Eosinophils Relative: 2 % (ref 0–5)
HEMATOCRIT: 35.8 % — AB (ref 39.0–52.0)
HEMOGLOBIN: 12.5 g/dL — AB (ref 13.0–17.0)
LYMPHS ABS: 2.2 10*3/uL (ref 0.7–4.0)
LYMPHS PCT: 23 % (ref 12–46)
MCH: 32.8 pg (ref 26.0–34.0)
MCHC: 34.9 g/dL (ref 30.0–36.0)
MCV: 94 fL (ref 78.0–100.0)
Monocytes Absolute: 0.8 10*3/uL (ref 0.1–1.0)
Monocytes Relative: 8 % (ref 3–12)
Neutro Abs: 6.5 10*3/uL (ref 1.7–7.7)
Neutrophils Relative %: 67 % (ref 43–77)
PLATELETS: 126 10*3/uL — AB (ref 150–400)
RBC: 3.81 MIL/uL — AB (ref 4.22–5.81)
RDW: 13.4 % (ref 11.5–15.5)
WBC: 9.6 10*3/uL (ref 4.0–10.5)

## 2014-11-26 LAB — GLUCOSE, CAPILLARY
GLUCOSE-CAPILLARY: 98 mg/dL (ref 70–99)
GLUCOSE-CAPILLARY: 108 mg/dL — AB (ref 70–99)
Glucose-Capillary: 89 mg/dL (ref 70–99)
Glucose-Capillary: 108 mg/dL — ABNORMAL HIGH (ref 70–99)
Glucose-Capillary: 107 mg/dL — ABNORMAL HIGH (ref 70–99)

## 2014-11-26 MED ORDER — MORPHINE SULFATE 2 MG/ML IJ SOLN
2.0000 mg | INTRAMUSCULAR | Status: DC | PRN
  Administered 2014-11-26: 2 mg via INTRAVENOUS
  Filled 2014-11-26: qty 1

## 2014-11-26 MED ORDER — METOPROLOL TARTRATE 1 MG/ML IV SOLN
2.5000 mg | INTRAVENOUS | Status: DC | PRN
  Filled 2014-11-26: qty 5

## 2014-11-26 MED ORDER — MORPHINE SULFATE 2 MG/ML IJ SOLN
2.0000 mg | INTRAMUSCULAR | Status: DC | PRN
  Administered 2014-11-26 – 2014-11-28 (×11): 2 mg via INTRAVENOUS
  Filled 2014-11-26 (×12): qty 1

## 2014-11-26 MED ORDER — SODIUM CHLORIDE 0.9 % IV SOLN
INTRAVENOUS | Status: DC
  Administered 2014-11-26 – 2014-11-27 (×2): via INTRAVENOUS

## 2014-11-26 MED ORDER — MORPHINE SULFATE 2 MG/ML IJ SOLN: 1.0000 mg | Freq: Once | INTRAMUSCULAR | Status: DC

## 2014-11-26 MED ORDER — PNEUMOCOCCAL VAC POLYVALENT 25 MCG/0.5ML IJ INJ
0.5000 mL | INJECTION | INTRAMUSCULAR | Status: AC
  Administered 2014-11-27: 0.5 mL via INTRAMUSCULAR
  Filled 2014-11-26: qty 0.5

## 2014-11-26 NOTE — Progress Notes (Signed)
Internal Medicine Attending  Date: 11/26/2014  Patient name: Randall Burnett Medical record number: [id number] Date of birth: 1986-01-06 Age: 28 y.o. Gender: male  I saw and evaluated the patient, and discussed his care with resident on A.M rounds; please see resident's note for details of clinical findings and plans.  Patient was admitted with acute pancreatitis likely secondary to alcohol.  Today he reports reports ongoing diffuse abdominal pain, and exam is notable for moderate diffuse tenderness.  Plans include continue NPO; pain control; CIWA protocol; follow CBC, renal function, electrolytes, and liver enzymes.  His blood pressure remains moderately elevated; although clonidine is not the optimal long-term antihypertensive medication, a clonidine patch would be reasonable while patient is NPO, with a transition to an oral regimen as soon as possible.

## 2014-11-26 NOTE — Progress Notes (Addendum)
  Subjective:   Day of hospitalization: 2  BP remains elevated.  No overnight events.  Pt rates his pain 12/10.  Denies any N/V.  States he is hungry and explained to him that we would not feel comfortable feeding him while he is in this much pain and that we would need to keep him NPO for now.  He is unhappy that we decreased his pain regimen yesterday and states he may "go to another hospital."  He received 1 dose of morphine this AM and only 2 doses yesterday.    Objective:   Vital signs in last 24 hours: Filed Vitals:   11/25/14 1705 11/25/14 2011 11/26/14 0517 11/26/14 0922  BP: 156/104 160/108 146/105 166/111  Pulse: 115 120 114 112  Temp: 98.6 F (37 C) 99.1 F (37.3 C) 98.5 F (36.9 C) 98.8 F (37.1 C)  TempSrc: Oral   Oral  Resp: 18 18 19 16  Height:      Weight:  198 lb 10.2 oz (90.1 kg)    SpO2: 98% 99% 100% 100%    Weight: Filed Weights   11/24/14 1532 11/25/14 0435 11/25/14 2011  Weight: 203 lb 0.7 oz (92.1 kg) 202 lb 13.2 oz (92 kg) 198 lb 10.2 oz (90.1 kg)    I/Os:  Intake/Output Summary (Last 24 hours) at 11/26/14 1658 Last data filed at 11/26/14 1344  Gross per 24 hour  Intake 3443.33 ml  Output   2975 ml  Net 468.33 ml    Physical Exam: Constitutional: Vital signs reviewed.  Patient is sitting lying in bed in no acute distress; appears very comfortable.     HEENT: [place]/AT; PERRL, EOMI, conjunctivae normal, no scleral icterus  Cardiovascular: RRR, no MRG Pulmonary/Chest: normal respiratory effort, no accessory muscle use, CTAB, no wheezes, rales, or rhonchi Abdominal: Soft, decreased BS, diffuse tenderness without guarding or rebound  Neurological: A&O x3, CN II-XII grossly intact; non-focal exam Extremities: 2+DP b/l, no C/C/E  Skin: Warm, dry and intact. Numerous tattoos.   Lab Results:  CBC:  Recent Labs Lab 11/24/14 0643 11/25/14 0323 11/26/14 0433  WBC 17.8* 12.1* 9.6  NEUTROABS 14.2*  --  6.5  HGB 17.0 14.0 12.5*  HCT 46.7 39.8  35.8*  MCV 92.5 96.4 94.0  PLT 186 125* 126*    Coagulation: No results for input(s): LABPROT, INR in the last 168 hours.  CBG:            Recent Labs Lab 11/25/14 1219 11/25/14 1704 11/25/14 2009 11/26/14 0519 11/26/14 0815 11/26/14 1151  GLUCAP 136* 117* 118* 89 98 108*           HA1C:       Recent Labs Lab 11/24/14 1038  HGBA1C 7.0*    Lipid Panel:  Recent Labs Lab 11/24/14 1159 11/25/14 1130  CHOL  --  127  HDL  --  38*  LDLCALC  --  11  TRIG  --  390*  CHOLHDL  --  3.3  LDLDIRECT 37  --     LFTs:  Recent Labs Lab 11/25/14 0323 11/25/14 1500 11/26/14 0433  AST 91*  --  26  ALT 65*  --  34  ALKPHOS 124*  --  88  BILITOT 2.2* 2.3* 1.0  PROT 7.1  --  6.8  ALBUMIN 3.2*  --  2.8*    Pancreatic Enzymes:  Recent Labs Lab 11/24/14 0643  LIPASE 72*    Lactic Acid/Procalcitonin:  Recent Labs Lab 11/24/14 1159  LATICACIDVEN 1.4      Ammonia: No results for input(s): AMMONIA in the last 168 hours.  Cardiac Enzymes:  Recent Labs Lab 11/24/14 1646 11/24/14 2203 11/25/14 0323  TROPONINI <0.30 <0.30 <0.30    EKG: EKG Interpretation  Date/Time:  Saturday November 24 2014 06:12:30 EST Ventricular Rate:  119 PR Interval:  117 QRS Duration: 83 QT Interval:  318 QTC Calculation: 447 R Axis:   63 Text Interpretation:  Age not entered, assumed to be  28 years old for purpose of ECG interpretation Sinus tachycardia with irregular rate Borderline T wave abnormalities ED PHYSICIAN INTERPRETATION AVAILABLE IN [facility] Confirmed by TEST, Record ([id number]) on 11/26/2014 7:42:31 AM   BNP: No results for input(s): PROBNP in the last 168 hours.  D-Dimer: No results for input(s): DDIMER in the last 168 hours.  Urinalysis:  Recent Labs Lab 11/24/14 0847  COLORURINE YELLOW  LABSPEC >1.046*  PHURINE 5.5  GLUCOSEU NEGATIVE  HGBUR NEGATIVE  BILIRUBINUR NEGATIVE  KETONESUR 15*  PROTEINUR NEGATIVE  UROBILINOGEN 0.2  NITRITE  NEGATIVE  LEUKOCYTESUR NEGATIVE    Micro Results: Recent Results (from the past 240 hour(s))  MRSA PCR Screening     Status: None   Collection Time: 11/24/14  3:36 PM  Result Value Ref Range Status   MRSA by PCR NEGATIVE NEGATIVE Final    Comment:        The GeneXpert MRSA Assay (FDA approved for NASAL specimens only), is one component of a comprehensive MRSA colonization surveillance program. It is not intended to diagnose MRSA infection nor to guide or monitor treatment for MRSA infections.     Blood Culture: No results found for: SDES, SPECREQUEST, CULT, REPTSTATUS  Studies/Results: US Abdomen Limited Ruq  11/25/2014   CLINICAL DATA:  Abdominal pain. History of diabetes, hypertension and alcohol abuse. Initial encounter.  EXAM: US ABDOMEN LIMITED - RIGHT UPPER QUADRANT  COMPARISON:  09/04/2014 ultrasound.  FINDINGS: Gallbladder:  Trace sludge. No gallstones, wall thickening or sonographic Murphy sign. There is a small amount of perihepatic and pericholecystic fluid.  Common bile duct:  Diameter: 4.7 mm.  Liver:  Stable increased hepatic echogenicity most consistent with steatosis. No focal lesions demonstrated.  IMPRESSION: 1. No acute findings demonstrated. 2. Echogenic liver most consistent with steatosis. No focal lesions identified. There is a small amount of perihepatic ascites. 3. Gallbladder sludge without wall thickening or biliary dilatation.   Electronically Signed   By: Camie Patience M.D.   On: 11/25/2014 16:54    Medications:  Scheduled Meds: . folic acid  1 mg Intravenous Daily  . heparin  5,000 Units Subcutaneous 3 times per day  . levETIRAcetam  500 mg Intravenous Q12H  .  morphine injection  1 mg Intravenous Once  . multivitamin with minerals  1 tablet Oral Daily  . nicotine  14 mg Transdermal Daily  . pantoprazole (PROTONIX) IV  40 mg Intravenous Q12H  . QUEtiapine  200 mg Oral QHS  . thiamine  100 mg Oral Daily   Or  . thiamine  100 mg Intravenous Daily    Continuous Infusions:  PRN Meds: hydrALAZINE, LORazepam **OR** LORazepam, morphine injection, ondansetron **OR** ondansetron (ZOFRAN) IV  Antibiotics: Antibiotics Given (last 72 hours)    None      Day of Hospitalization: 2  Consults:    Assessment/Plan:   Active Problems:   Diabetes mellitus   Alcohol abuse   Seizure disorder   Acute pancreatitis   Uncontrolled hypertension   Depression with anxiety  Acute pancreatitis Likely 2/2 to alcohol abuse  but his TGA is also 499. No gallbladder wall thickening or gall stones seen on CT, and also normal appendix. CT did show gastrohepatic and hepatoduodenal lymphadenopathy, also duodenitis, likely reactive to pancreatitis. Lipase not impressive at 72. No pseudocyst.  Today, continues to have 12/10 pain.  Reports pain medicine helps for about 30 minutes then returns.  Tbili increased-->Abd US shows sludge without gallbladder wall thickening.   -NPO given continued pain  -continue 150ml/h NS  -continue morphine 2mg IV q3hr -will begin fibrate for his hypertriglycerdiemia.  Hypertension  -continue hydralazine PRN  Polysubstance abuse Drinks 4 tall beers/day; hx of heroin, methamphetamines, cocaine abuse and needle sharing.  CIWA scores reasonable.  -encouraged quitting. Consulted SW to help him with community resources - CIWA protocol  Anion gap met acidosis Improving- likley 2/2 to alcohol ketoacidosis, UA showed ketones. Lactic acid normal.  -repeat lactic acid  Hep C -needs PCP for further mgmt  DM II -SSI   F/E/N Fluids- NS 200ml/h Electrolytes- Replete as needed  Nutrition- NPO   Disposition Disposition is deferred, awaiting improvement of current medical problems.  Anticipated discharge in approximately 1-2 day(s).   LOS: 2 days    S , MD PGY-2, Internal Medicine Teaching Service 11/26/2014, 4:58 PM     

## 2014-11-26 NOTE — Progress Notes (Signed)
Nutrition Brief Note  Patient identified on the Malnutrition Screening Tool (MST) Report  Wt Readings from Last 15 Encounters:  11/25/14 198 lb 10.2 oz (90.1 kg)  09/04/14 197 lb 11.2 oz (89.676 kg)  03/07/14 200 lb (90.719 kg)    Body mass index is 29.32 kg/(m^2). Patient meets criteria for overweight based on current BMI.   Current diet order is NPO. Pt reports that he feels hungry. No recent weight loss. Labs and medications reviewed.   No nutrition interventions warranted at this time. If nutrition issues arise, please consult RD.   Emmaline KluverHaley Aldyn Toon MS, RD, LDN

## 2014-11-27 LAB — LACTIC ACID, PLASMA: LACTIC ACID, VENOUS: 0.4 mmol/L — AB (ref 0.5–2.2)

## 2014-11-27 LAB — CBC
HEMATOCRIT: 35.3 % — AB (ref 39.0–52.0)
Hemoglobin: 12.4 g/dL — ABNORMAL LOW (ref 13.0–17.0)
MCH: 32.6 pg (ref 26.0–34.0)
MCHC: 35.1 g/dL (ref 30.0–36.0)
MCV: 92.9 fL (ref 78.0–100.0)
Platelets: 156 10*3/uL (ref 150–400)
RBC: 3.8 MIL/uL — ABNORMAL LOW (ref 4.22–5.81)
RDW: 13.7 % (ref 11.5–15.5)
WBC: 7.5 10*3/uL (ref 4.0–10.5)

## 2014-11-27 LAB — COMPREHENSIVE METABOLIC PANEL
ALBUMIN: 2.8 g/dL — AB (ref 3.5–5.2)
ALK PHOS: 77 U/L (ref 39–117)
ALT: 24 U/L (ref 0–53)
ANION GAP: 18 — AB (ref 5–15)
AST: 23 U/L (ref 0–37)
BILIRUBIN TOTAL: 0.7 mg/dL (ref 0.3–1.2)
BUN: 4 mg/dL — AB (ref 6–23)
CO2: 14 mEq/L — ABNORMAL LOW (ref 19–32)
CREATININE: 0.49 mg/dL — AB (ref 0.50–1.35)
Calcium: 8.6 mg/dL (ref 8.4–10.5)
Chloride: 102 mEq/L (ref 96–112)
GFR calc non Af Amer: >90 mL/min (ref >90)
GLUCOSE: 80 mg/dL (ref 70–99)
POTASSIUM: 3.3 meq/L — AB (ref 3.7–5.3)
Sodium: 134 mEq/L — ABNORMAL LOW (ref 137–147)
Total Protein: 7 g/dL (ref 6.0–8.3)

## 2014-11-27 LAB — OSMOLALITY: Osmolality: 275 mOsm/kg (ref 275–300)

## 2014-11-27 LAB — GLUCOSE, CAPILLARY
GLUCOSE-CAPILLARY: 89 mg/dL (ref 70–99)
Glucose-Capillary: 102 mg/dL — ABNORMAL HIGH (ref 70–99)
Glucose-Capillary: 132 mg/dL — ABNORMAL HIGH (ref 70–99)
Glucose-Capillary: 138 mg/dL — ABNORMAL HIGH (ref 70–99)
Glucose-Capillary: 161 mg/dL — ABNORMAL HIGH (ref 70–99)
Glucose-Capillary: 73 mg/dL (ref 70–99)
Glucose-Capillary: 80 mg/dL (ref 70–99)

## 2014-11-27 LAB — SALICYLATE LEVEL: Salicylate Lvl: <2 mg/dL — ABNORMAL LOW (ref 2.8–20.0)

## 2014-11-27 MED ORDER — ENALAPRILAT 1.25 MG/ML IV SOLN
1.2500 mg | Freq: Once | INTRAVENOUS | Status: DC
  Filled 2014-11-27: qty 1

## 2014-11-27 MED ORDER — ENALAPRILAT 1.25 MG/ML IV SOLN
1.2500 mg | Freq: Once | INTRAVENOUS | Status: AC
  Administered 2014-11-27: 1.25 mg via INTRAVENOUS
  Filled 2014-11-27: qty 1

## 2014-11-27 MED ORDER — DEXTROSE-NACL 5-0.9 % IV SOLN
INTRAVENOUS | Status: DC
  Administered 2014-11-27 – 2014-11-28 (×4): via INTRAVENOUS

## 2014-11-27 MED ORDER — DEXTROSE-NACL 5-0.9 % IV SOLN: Freq: Once | INTRAVENOUS | Status: DC

## 2014-11-27 NOTE — Care Management Note (Signed)
CARE MANAGEMENT NOTE 11/27/2014  Patient:  Randall Burnett,Randall Burnett   Account Number:  [id number]  Date Initiated:  11/27/2014  Documentation initiated by:  Johny ShockOYAL,Haydin Calandra  Subjective/Objective Assessment:   CM following for progression and d/c planning.     Action/Plan:   [facility]/2014 [facility]  Unable to schedule [facility] until d/c date is known.   Anticipated DC Date:  11/30/2014   Anticipated DC Plan:  HOME/SELF CARE         Choice offered to / List presented to:             Status of service:   Medicare Important Message given?   (If response is "NO", the following Medicare IM given date fields will be blank) Date Medicare IM given:   Medicare IM given by:   Date Additional Medicare IM given:   Additional Medicare IM given by:    Discharge Disposition:    Per UR Regulation:    If discussed at Long Length of Stay Meetings, dates discussed:    Comments:

## 2014-11-27 NOTE — Progress Notes (Signed)
Internal Medicine Attending  Date: 11/27/2014  Patient name: Randall Burnett Medical record number: [id number] Date of birth: 11-May-1986 Age: 28 y.o. Gender: male  I saw and evaluated the patient. I discussed patient and reviewed the resident's note by Dr. Tasia CatchingsAhmed, and I agree with the resident's findings and plans as documented in his note, with the following additional comments.  Abdominal exam is somewhat less tender this afternoon, and patient reports some improvement in his pain.  Agree with keeping patient NPO for now pending further improvement in his pain and exam.

## 2014-11-27 NOTE — Progress Notes (Addendum)
Subjective: Continues to have abdominal pain with radiation to the back. States his pain improves with morphine but then it comes back again. Denies sob, n/v, dysuria.   Objective: Vital signs in last 24 hours: Filed Vitals:   11/26/14 2011 11/27/14 0200 11/27/14 0405 11/27/14 1000  BP: 163/116 153/116 172/111 160/115  Pulse: 102 105 107 107  Temp: 99.5 F (37.5 C) 99.5 F (37.5 C) 99.5 F (37.5 C) 98.1 F (36.7 C)  TempSrc: Oral Oral Oral Oral  Resp: 18 18 18 18   Height:      Weight: 198 lb 10.2 oz (90.101 kg)     SpO2: 100% 100% 99% 100%   Weight change: 0.1 oz (0.001 kg)  Intake/Output Summary (Last 24 hours) at 11/27/14 1123 Last data filed at 11/27/14 0900  Gross per 24 hour  Intake   1625 ml  Output   3750 ml  Net  -2125 ml   Physical Exam   Vitals reviewed. General: appears to be anxious, sitting in bed. HEENT: PERRL, EOMI, no scleral icterus Cardiac: tachycardic, regular rhythm, no rubs, murmurs or gallops Pulm: clear to auscultation bilaterally, no wheezes, rales, or rhonchi Abd: soft, mild TTP on RLQ, pigastric region, better than before. Ext: warm and well perfused, no pedal edema Neuro: alert and oriented X3, cranial nerves II-XII grossly intact, strength and sensation to light touch equal in bilateral upper and lower extremities. Tremors improved.    Lab Results: Basic Metabolic Panel:  Recent Labs Lab 11/24/14 1038 11/25/14 0323 11/26/14 0433 11/27/14 0830  NA  --  130* 131* 134*  K  --  3.7 3.5* 3.3*  CL  --  95* 96 102  CO2  --  19 17* 14*  GLUCOSE  --  123* 88 80  BUN  --  4* 4* 4*  CREATININE  --  0.59 0.60 0.49*  CALCIUM  --  8.6 8.5 8.6  MG 1.2* 1.8  --   --   PHOS 2.3  --   --   --    Liver Function Tests:  Recent Labs Lab 11/26/14 0433 11/27/14 0830  AST 26 23  ALT 34 24  ALKPHOS 88 77  BILITOT 1.0 0.7  PROT 6.8 7.0  ALBUMIN 2.8* 2.8*    Recent Labs Lab 11/24/14 0643  LIPASE 72*   No results for input(s):  AMMONIA in the last 168 hours. CBC:  Recent Labs Lab 11/24/14 0643  11/26/14 0433 11/27/14 0830  WBC 17.8*  < > 9.6 7.5  NEUTROABS 14.2*  --  6.5  --   HGB 17.0  < > 12.5* 12.4*  HCT 46.7  < > 35.8* 35.3*  MCV 92.5  < > 94.0 92.9  PLT 186  < > 126* 156  < > = values in this interval not displayed. Cardiac Enzymes:  Recent Labs Lab 11/24/14 1646 11/24/14 2203 11/25/14 0323  TROPONINI <0.30 <0.30 <0.30   BNP: No results for input(s): PROBNP in the last 168 hours. D-Dimer: No results for input(s): DDIMER in the last 168 hours. CBG:  Recent Labs Lab 11/26/14 1151 11/26/14 1654 11/26/14 2006 11/27/14 0014 11/27/14 0414 11/27/14 0754  GLUCAP 108* 108* 107* 89 80 73   Hemoglobin A1C:  Recent Labs Lab 11/24/14 1038  HGBA1C 7.0*   Fasting Lipid Panel:  Recent Labs Lab 11/24/14 1159 11/25/14 1130  CHOL  --  127  HDL  --  38*  LDLCALC  --  11  TRIG  --  390*  CHOLHDL  --  3.3  LDLDIRECT 37  --    Thyroid Function Tests: No results for input(s): TSH, T4TOTAL, FREET4, T3FREE, THYROIDAB in the last 168 hours. Coagulation: No results for input(s): LABPROT, INR in the last 168 hours. Anemia Panel: No results for input(s): VITAMINB12, FOLATE, FERRITIN, TIBC, IRON, RETICCTPCT in the last 168 hours. Urine Drug Screen: Drugs of Abuse     Component Value Date/Time   LABOPIA NONE DETECTED 11/24/2014 0847   COCAINSCRNUR NONE DETECTED 11/24/2014 0847   LABBENZ NONE DETECTED 11/24/2014 0847   AMPHETMU POSITIVE* 11/24/2014 0847   THCU NONE DETECTED 11/24/2014 0847   LABBARB NONE DETECTED 11/24/2014 0847    Alcohol Level: No results for input(s): ETH in the last 168 hours. Urinalysis:  Recent Labs Lab 11/24/14 0847  COLORURINE YELLOW  LABSPEC >1.046*  PHURINE 5.5  GLUCOSEU NEGATIVE  HGBUR NEGATIVE  BILIRUBINUR NEGATIVE  KETONESUR 15*  PROTEINUR NEGATIVE  UROBILINOGEN 0.2  NITRITE NEGATIVE  LEUKOCYTESUR NEGATIVE   Misc. Labs:  Micro  Results: Recent Results (from the past 240 hour(s))  MRSA PCR Screening     Status: None   Collection Time: 11/24/14  3:36 PM  Result Value Ref Range Status   MRSA by PCR NEGATIVE NEGATIVE Final    Comment:        The GeneXpert MRSA Assay (FDA approved for NASAL specimens only), is one component of a comprehensive MRSA colonization surveillance program. It is not intended to diagnose MRSA infection nor to guide or monitor treatment for MRSA infections.    Studies/Results: US Abdomen Limited Ruq  11/25/2014   CLINICAL DATA:  Abdominal pain. History of diabetes, hypertension and alcohol abuse. Initial encounter.  EXAM: US ABDOMEN LIMITED - RIGHT UPPER QUADRANT  COMPARISON:  09/04/2014 ultrasound.  FINDINGS: Gallbladder:  Trace sludge. No gallstones, wall thickening or sonographic Murphy sign. There is a small amount of perihepatic and pericholecystic fluid.  Common bile duct:  Diameter: 4.7 mm.  Liver:  Stable increased hepatic echogenicity most consistent with steatosis. No focal lesions demonstrated.  IMPRESSION: 1. No acute findings demonstrated. 2. Echogenic liver most consistent with steatosis. No focal lesions identified. There is a small amount of perihepatic ascites. 3. Gallbladder sludge without wall thickening or biliary dilatation.   Electronically Signed   By: Camie Patience M.D.   On: 11/25/2014 16:54   Medications: I have reviewed the patient's current medications. Scheduled Meds: . folic acid  1 mg Intravenous Daily  . heparin  5,000 Units Subcutaneous 3 times per day  . levETIRAcetam  500 mg Intravenous Q12H  .  morphine injection  1 mg Intravenous Once  . multivitamin with minerals  1 tablet Oral Daily  . nicotine  14 mg Transdermal Daily  . pantoprazole (PROTONIX) IV  40 mg Intravenous Q12H  . QUEtiapine  200 mg Oral QHS  . thiamine  100 mg Oral Daily   Or  . thiamine  100 mg Intravenous Daily   Continuous Infusions: . dextrose 5 % and 0.9% NaCl 150 mL/hr at  11/27/14 1019   PRN Meds:.LORazepam **OR** LORazepam, metoprolol, morphine injection, ondansetron **OR** ondansetron (ZOFRAN) IV Assessment/Plan: Active Problems:   Diabetes mellitus   Alcohol abuse   Seizure disorder   Acute pancreatitis   Uncontrolled hypertension   Depression with anxiety  28 yo male with hx DM II, HTN, multiple psych disorders, seizure hx, medication nocompliance, polysubstance abuse including alcohol abuse here with RLQ ab pain, CT finding of acute pancreatitis.  Acute pancreatitis - likely  2/2 to alcohol abuse but his TGA is also 499. No gallbladder wall thickening or gall stones seen on CT, and also normal appendix. CT did show gastrohepatic and hepatoduodenal lymphadenopathy, also duodenitis, likely reactive to pancreatitis. Lipase only 72. No pseudocysts. -since he is still having significant pain requiring IV pain meds, will keep him NPO. Don't want to stimulate pancreas with anything right now. -  IVF bolused 1L in ED and now getting 135m/hr. Changed to d5 normal 150cc/hr to make sure he is not hypoglycemic as hig CBG getting in 70-80's. Keep NPO until pain improves.  - will keep on morphine 259mq3hr for now. It seems to be well controlled with some breakthrough but he doesn't seem too distressed or in too much pain. -  Tbili increased to 2.2 with 1.4 indirect , alk phos 124 (increased but could be a acute phase reactant), also AST/ALT slightly increased. RUQ doesn't show any gall bladder wall thickening but shows biliary sludge. Continue to monitor. - encouraged quitting alcohol abuse. 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  Chest pain/epigastric pain - likely from the pancreatitis or GERD. Only happens when moving around. Could have gastritis/PUD as well. Continuing protonix IV. Will send him with po protonix on discharge. trops negative. No ekg changes.  Hypertension - was in 17858-850ystolic and 11277-412iastolic. Now  running 150-160/110's.   -when not NPO, will restart his lisinopril. Will not give him clonidine since it's not first line for HTN and he is not very compliant so he is at risk of rebound HTN more than others. - for now gave her enalaprilat 1.2568mV form ace Inhibitor. Will see if it helps, otherwise can give more.  Polysubstance abuse - significant alcohol abuse hx (drinks at least 4 tall beers a day), hx of heroin, methamphetamines, cocaine abuse and needle sharing.  -encouraged quitting. Consulted SW to help him with community resources - CIWA protocol. Banana bag (IV).  Anion gap met acidosis - improved from admission but still not resolved. 22 > 16>18>18 now. likley 2/2 to alcohol ketoacidosis, UA showed ketones. Lactic acid normal. Also could be from the fact he is not eating for last few days. - will check salicylate level, Venous gas, serum osm to rule out there causes of acidosis.   Hematemesis - resolved. Likely 2/2 to gastritis or PUD since has hx of alcohol abuse and also BC powder usage.  Hep C - known Hep C, viral log load 6.38. Doesn't follow with ID. - HIV negative. [facility] and may need ID follow up outpatient.  DM II - hgba1c 7.9 08/2014, now 7.0. On glipizide but ran out. - SSI here. Will put him back on glipizide on discharge  Hx of seizures - was taking keppra only as needed. Explained this is not a PRN med. On keppra 500m20mD IV now, switch to PO when able.  Depression/anxiety/suicidal - follows with [facility]. Takes seroquel. Restart it when not NPO..  DMarland Kitchenspo: Disposition is deferred at this time, awaiting improvement of current medical problems.  Anticipated discharge in approximately 1-2 day(s).   The patient does not have a current PCP (No Pcp Per Patient) and [facility].  The patient does have transportation limitations that hinder transportation to clinic  appointments.  .Services Needed at time of discharge: Y = Yes, Blank = No PT:   OT:   RN:  Equipment:   Other:     LOS: 3 days   Dellia Nims, MD 11/27/2014, 11:23 AM

## 2014-11-28 LAB — BASIC METABOLIC PANEL
ANION GAP: 12 (ref 5–15)
BUN: 4 mg/dL — AB (ref 6–23)
CHLORIDE: 103 meq/L (ref 96–112)
CO2: 20 mEq/L (ref 19–32)
Calcium: 8.9 mg/dL (ref 8.4–10.5)
Creatinine, Ser: 0.56 mg/dL (ref 0.50–1.35)
GFR calc Af Amer: >90 mL/min (ref >90)
GFR calc non Af Amer: >90 mL/min (ref >90)
Glucose, Bld: 114 mg/dL — ABNORMAL HIGH (ref 70–99)
Potassium: 3.5 mEq/L — ABNORMAL LOW (ref 3.7–5.3)
Sodium: 135 mEq/L — ABNORMAL LOW (ref 137–147)

## 2014-11-28 LAB — COMPREHENSIVE METABOLIC PANEL
ALT: 20 U/L (ref 0–53)
AST: 18 U/L (ref 0–37)
Albumin: 2.7 g/dL — ABNORMAL LOW (ref 3.5–5.2)
Alkaline Phosphatase: 71 U/L (ref 39–117)
Anion gap: 13 (ref 5–15)
BILIRUBIN TOTAL: 0.6 mg/dL (ref 0.3–1.2)
BUN: 3 mg/dL — ABNORMAL LOW (ref 6–23)
CO2: 20 meq/L (ref 19–32)
Calcium: 8.7 mg/dL (ref 8.4–10.5)
Chloride: 104 mEq/L (ref 96–112)
Creatinine, Ser: 0.54 mg/dL (ref 0.50–1.35)
GFR calc non Af Amer: >90 mL/min (ref >90)
GLUCOSE: 128 mg/dL — AB (ref 70–99)
POTASSIUM: 2.9 meq/L — AB (ref 3.7–5.3)
Sodium: 137 mEq/L (ref 137–147)
Total Protein: 6.7 g/dL (ref 6.0–8.3)

## 2014-11-28 LAB — GLUCOSE, CAPILLARY
GLUCOSE-CAPILLARY: 152 mg/dL — AB (ref 70–99)
GLUCOSE-CAPILLARY: 112 mg/dL — AB (ref 70–99)
Glucose-Capillary: 153 mg/dL — ABNORMAL HIGH (ref 70–99)
Glucose-Capillary: 126 mg/dL — ABNORMAL HIGH (ref 70–99)
Glucose-Capillary: 187 mg/dL — ABNORMAL HIGH (ref 70–99)

## 2014-11-28 MED ORDER — ENALAPRILAT 1.25 MG/ML IV SOLN
1.2500 mg | Freq: Once | INTRAVENOUS | Status: AC
  Administered 2014-11-28: 1.25 mg via INTRAVENOUS
  Filled 2014-11-28: qty 1

## 2014-11-28 MED ORDER — LISINOPRIL 10 MG PO TABS
10.0000 mg | ORAL_TABLET | Freq: Every day | ORAL | Status: DC
  Administered 2014-11-29: 10 mg via ORAL
  Filled 2014-11-28: qty 1

## 2014-11-28 MED ORDER — PANTOPRAZOLE SODIUM 40 MG PO TBEC
40.0000 mg | DELAYED_RELEASE_TABLET | Freq: Every day | ORAL | Status: DC
  Administered 2014-11-29: 40 mg via ORAL
  Filled 2014-11-28: qty 1

## 2014-11-28 MED ORDER — POTASSIUM CHLORIDE CRYS ER 20 MEQ PO TBCR: 20.0000 meq | EXTENDED_RELEASE_TABLET | Freq: Once | ORAL | Status: DC

## 2014-11-28 MED ORDER — POTASSIUM CHLORIDE 10 MEQ/100ML IV SOLN
10.0000 meq | INTRAVENOUS | Status: AC
  Administered 2014-11-28 (×4): 10 meq via INTRAVENOUS
  Filled 2014-11-28 (×4): qty 100

## 2014-11-28 MED ORDER — LEVETIRACETAM 500 MG PO TABS
500.0000 mg | ORAL_TABLET | Freq: Two times a day (BID) | ORAL | Status: DC
  Administered 2014-11-28 – 2014-11-29 (×3): 500 mg via ORAL
  Filled 2014-11-28 (×4): qty 1

## 2014-11-28 MED ORDER — FOLIC ACID 1 MG PO TABS
1.0000 mg | ORAL_TABLET | Freq: Every day | ORAL | Status: DC
  Administered 2014-11-29: 1 mg via ORAL
  Filled 2014-11-28: qty 1

## 2014-11-28 MED ORDER — LISINOPRIL 5 MG PO TABS
5.0000 mg | ORAL_TABLET | Freq: Every day | ORAL | Status: DC
  Administered 2014-11-28: 5 mg via ORAL
  Filled 2014-11-28: qty 1

## 2014-11-28 MED ORDER — ACETAMINOPHEN 325 MG PO TABS: 650.0000 mg | ORAL_TABLET | Freq: Four times a day (QID) | ORAL | Status: DC | PRN

## 2014-11-28 NOTE — Clinical Documentation Improvement (Signed)
Potassium down to 3.3 on 11/27/14 and 2.9 on 11/28/14. Potassium 10 mEq q 1 hr x 4 ordered.  Please identify any clinical conditions associated with the abnormal potassium level requiring treatment and document in your progress note and carry over to the discharge summary.    Component      Potassium  Latest Ref Rng      3.7 - 5.3 mEq/L  11/24/2014     6:43 AM 4.5  11/25/2014     3:23 AM 3.7  11/26/2014     4:33 AM 3.5 (L)  11/27/2014     8:30 AM 3.3 (L)  11/28/2014     5:58 AM 2.9 (LL)  11/28/2014     11:30 AM 3.5 (L)   Possible Clinical Conditions: -Hypokalemia -Other condition (please specify) -Unable to determine at present  Thank you, Doy MinceVangela Shatira Dobosz, RN [phone] Clinical Documentation Specialist

## 2014-11-28 NOTE — Progress Notes (Signed)
CRITICAL VALUE ALERT  Critical value received:  K+ 2.9  Date of notification:  11/28/14  Time of notification:  0656  Critical value read back:Yes.    Nurse who received alert:  Feliciana RossettiLaura Yoltzin Ransom  MD notified (1st page):  Dr. Donnamarie PoagK. Kirby  Time of first page:  [phone]  MD notified (2nd page):  Time of second page:  Responding MD:  Dr. Gara Kroneriana Truong  Time MD responded:  [phone]

## 2014-11-28 NOTE — Care Management Note (Signed)
CARE MANAGEMENT NOTE 11/28/2014  Patient:  Randall Burnett,Randall Burnett   Account Number:  [id number]  Date Initiated:  11/27/2014  Documentation initiated by:  Johny ShockOYAL,Kinga Cassar  Subjective/Objective Assessment:   CM following for progression and d/c planning.     Action/Plan:   11/27/2014 [facility]  Unable to schedule [facility] until d/c date is known.  11/28/2014 [facility], will cont to call on 11/29/14   Anticipated DC Date:  11/30/2014   Anticipated DC Plan:  HOME/SELF CARE         Choice offered to / List presented to:             Status of service:   Medicare Important Message given?   (If response is "NO", the following Medicare IM given date fields will be blank) Date Medicare IM given:   Medicare IM given by:   Date Additional Medicare IM given:   Additional Medicare IM given by:    Discharge Disposition:    Per UR Regulation:    If discussed at Long Length of Stay Meetings, dates discussed:    Comments:

## 2014-11-28 NOTE — Progress Notes (Signed)
Subjective: Pt has no complaints this morning. Had 2 BMs yesterday that were like his usual BMs. Would like to try a clear diet today.  Objective: Vital signs in last 24 hours: Filed Vitals:   11/27/14 1957 11/28/14 0021 11/28/14 0352 11/28/14 0600  BP: 159/112 160/104 159/107 158/98  Pulse: 111 93 112 91  Temp: 98.7 F (37.1 C)  98.5 F (36.9 C)   TempSrc: Oral  Oral   Resp: 18  18   Height:      Weight: 198 lb 1.2 oz (89.846 kg)     SpO2: 100%  99%    Weight change: -9 oz (-0.255 kg)  Intake/Output Summary (Last 24 hours) at 11/28/14 0715 Last data filed at 11/28/14 0427  Gross per 24 hour  Intake      0 ml  Output   1650 ml  Net  -1650 ml   Physical Exam   Vitals reviewed. General: NAD, sitting upright in bed HEENT: moist mucous membranes Cardiac: regular rhythm, no rubs, murmurs or gallops Pulm: clear to auscultation bilaterally, no wheezes, rales, or rhonchi Abd: non tender to palpation, soft, active bowel sounds Ext: no pedal edema Neuro: alert and oriented X3, cranial nerves II-XII grossly intact  Lab Results: Basic Metabolic Panel:  Recent Labs Lab 11/24/14 1038 11/25/14 0323  11/27/14 0830 11/28/14 0558  NA  --  130*  < > 134* 137  K  --  3.7  < > 3.3* 2.9*  CL  --  95*  < > 102 104  CO2  --  19  < > 14* 20  GLUCOSE  --  123*  < > 80 128*  BUN  --  4*  < > 4* 3*  CREATININE  --  0.59  < > 0.49* 0.54  CALCIUM  --  8.6  < > 8.6 8.7  MG 1.2* 1.8  --   --   --   PHOS 2.3  --   --   --   --   < > = values in this interval not displayed. Liver Function Tests:  Recent Labs Lab 11/27/14 0830 11/28/14 0558  AST 23 18  ALT 24 20  ALKPHOS 77 71  BILITOT 0.7 0.6  PROT 7.0 6.7  ALBUMIN 2.8* 2.7*    CBC:  Recent Labs Lab 11/24/14 0643  11/26/14 0433 11/27/14 0830  WBC 17.8*  < > 9.6 7.5  NEUTROABS 14.2*  --  6.5  --   HGB 17.0  < > 12.5* 12.4*  HCT 46.7  < > 35.8* 35.3*  MCV 92.5  < > 94.0 92.9  PLT 186  < > 126* 156  < > = values in  this interval not displayed. Cardiac Enzymes:  CBG:  Recent Labs Lab 11/27/14 0754 11/27/14 1154 11/27/14 1728 11/27/14 1954 11/27/14 2351 11/28/14 0424  GLUCAP 73 102* 138* 161* 132* 152*   Hemoglobin A1C:  Recent Labs Lab 11/24/14 1038  HGBA1C 7.0*   Fasting Lipid Panel:  Recent Labs Lab 11/24/14 1159 11/25/14 1130  CHOL  --  127  HDL  --  38*  LDLCALC  --  11  TRIG  --  390*  CHOLHDL  --  3.3  LDLDIRECT 37  --    Urine Drug Screen: Drugs of Abuse     Component Value Date/Time   LABOPIA NONE DETECTED 11/24/2014 [place] DETECTED 11/24/2014 [place] DETECTED 11/24/2014 0847   AMPHETMU POSITIVE* 11/24/2014 0847  THCU NONE DETECTED 11/24/2014 0847   LABBARB NONE DETECTED 11/24/2014 0847     Medications: I have reviewed the patient's current medications. Scheduled Meds: . enalaprilat  1.25 mg Intravenous Once  . folic acid  1 mg Intravenous Daily  . heparin  5,000 Units Subcutaneous 3 times per day  . levETIRAcetam  500 mg Intravenous Q12H  .  morphine injection  1 mg Intravenous Once  . multivitamin with minerals  1 tablet Oral Daily  . nicotine  14 mg Transdermal Daily  . pantoprazole (PROTONIX) IV  40 mg Intravenous Q12H  . potassium chloride  10 mEq Intravenous Q1 Hr x 4  . QUEtiapine  200 mg Oral QHS  . thiamine  100 mg Oral Daily   Or  . thiamine  100 mg Intravenous Daily   Continuous Infusions: . 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   Polysubstance abuse  - CIWA protocol.   Anion gap met acidosis - 22 on admission, resolved- now 13. Likley 2/2 to alcohol ketoacidosis, UA showed ketones. Lactic acid normal. Also could be from the fact he is not eating for last few days. - salicylate level negative - serum osm WNL - VBG pending  Hep C - known Hep C, viral log load 6.38. Doesn't follow with ID. - HIV negative. [facility] and may need ID follow up outpatient.  DM II - hgba1c 7.9 08/2014, now 7.0. On glipizide but ran out. - SSI here. Will put him back on glipizide on discharge  Hx of seizures - was taking keppra only as needed. Explained this is not a PRN med. On keppra 500m8mD IV now, will transition to po.   Depression/anxiety/suicidal - follows with [facility]. Takes seroquel. Resume on d/c  FEN--  - clear liq, advance as tolerated - K+ 2.9, repleted w/ 4 runs KCl  Dispo: d/c home today or tomorrow  The patient does not have a current PCP (No Pcp Per Patient) and [facility].  The patient does have transportation limitations that hinder transportation to clinic appointments.  .Services Needed at time of discharge: Y = Yes, Blank = No PT:   OT:   RN:   Equipment:   Other:     LOS: 4 days   DianJulious Oka 11/28/2014, 7:15 AM

## 2014-11-29 DIAGNOSIS — F418 Other specified anxiety disorders: Secondary | ICD-10-CM

## 2014-11-29 DIAGNOSIS — I10 Essential (primary) hypertension: Secondary | ICD-10-CM

## 2014-11-29 DIAGNOSIS — F1010 Alcohol abuse, uncomplicated: Secondary | ICD-10-CM

## 2014-11-29 DIAGNOSIS — G40909 Epilepsy, unspecified, not intractable, without status epilepticus: Secondary | ICD-10-CM

## 2014-11-29 DIAGNOSIS — K859 Acute pancreatitis, unspecified: Principal | ICD-10-CM

## 2014-11-29 LAB — BASIC METABOLIC PANEL
Anion gap: 14 (ref 5–15)
BUN: <3 mg/dL — ABNORMAL LOW (ref 6–23)
CO2: 21 mEq/L (ref 19–32)
Calcium: 9.4 mg/dL (ref 8.4–10.5)
Chloride: 107 mEq/L (ref 96–112)
Creatinine, Ser: 0.58 mg/dL (ref 0.50–1.35)
GLUCOSE: 111 mg/dL — AB (ref 70–99)
POTASSIUM: 3.7 meq/L (ref 3.7–5.3)
SODIUM: 142 meq/L (ref 137–147)

## 2014-11-29 LAB — GLUCOSE, CAPILLARY
Glucose-Capillary: 144 mg/dL — ABNORMAL HIGH (ref 70–99)
Glucose-Capillary: 162 mg/dL — ABNORMAL HIGH (ref 70–99)

## 2014-11-29 MED ORDER — QUETIAPINE FUMARATE ER 200 MG PO TB24: 200.0000 mg | ORAL_TABLET | Freq: Every day | ORAL | Status: DC

## 2014-11-29 MED ORDER — FOLIC ACID 1 MG PO TABS: 1.0000 mg | ORAL_TABLET | Freq: Every day | ORAL | Status: AC

## 2014-11-29 MED ORDER — LISINOPRIL 10 MG PO TABS: 10.0000 mg | ORAL_TABLET | Freq: Every day | ORAL | Status: DC

## 2014-11-29 MED ORDER — LEVETIRACETAM 500 MG PO TABS: 500.0000 mg | ORAL_TABLET | Freq: Two times a day (BID) | ORAL | Status: DC

## 2014-11-29 MED ORDER — VITAMIN B-1 100 MG PO TABS
100.0000 mg | ORAL_TABLET | Freq: Every day | ORAL | Status: AC
Start: 2014-11-29 — End: ?

## 2014-11-29 MED ORDER — GLIPIZIDE 2.5 MG HALF TABLET: 2.5000 mg | ORAL_TABLET | Freq: Every day | ORAL | Status: DC

## 2014-11-29 MED ORDER — NICOTINE 14 MG/24HR TD PT24: 14.0000 mg | MEDICATED_PATCH | Freq: Every day | TRANSDERMAL | Status: AC

## 2014-11-29 MED ORDER — QUETIAPINE FUMARATE ER 200 MG PO TB24: 200.0000 mg | ORAL_TABLET | Freq: Every day | ORAL | Status: AC

## 2014-11-29 NOTE — Discharge Instructions (Signed)
STOP taking clonidine.   We increased you lisinopril dose to 10mg  daily.    Acute Pancreatitis Acute pancreatitis is a disease in which the pancreas becomes suddenly inflamed. The pancreas is a large gland located behind your stomach. The pancreas produces enzymes that help digest food. The pancreas also releases the hormones glucagon and insulin that help regulate blood sugar. Damage to the pancreas occurs when the digestive enzymes from the pancreas are activated and begin attacking the pancreas before being released into the intestine. Most acute attacks last a couple of days and can cause serious complications. Some people become dehydrated and develop low blood pressure. In severe cases, bleeding into the pancreas can lead to shock and can be life-threatening. The lungs, heart, and kidneys may fail. CAUSES  Pancreatitis can happen to anyone. In some cases, the cause is unknown. Most cases are caused by:  Alcohol abuse.  Gallstones. Other less common causes are:  Certain medicines.  Exposure to certain chemicals.  Infection.  Damage caused by an accident (trauma).  Abdominal surgery. SYMPTOMS   Pain in the upper abdomen that may radiate to the back.  Tenderness and swelling of the abdomen.  Nausea and vomiting. DIAGNOSIS  Your caregiver will perform a physical exam. Blood and stool tests may be done to confirm the diagnosis. Imaging tests may also be done, such as X-rays, CT scans, or an ultrasound of the abdomen. TREATMENT  Treatment usually requires a stay in the hospital. Treatment may include:  Pain medicine.  Fluid replacement through an intravenous line (IV).  Placing a tube in the stomach to remove stomach contents and control vomiting.  Not eating for 3 or 4 days. This gives your pancreas a rest, because enzymes are not being produced that can cause further damage.  Antibiotic medicines if your condition is caused by an infection.  Surgery of the pancreas or  gallbladder. HOME CARE INSTRUCTIONS   Follow the diet advised by your caregiver. This may involve avoiding alcohol and decreasing the amount of fat in your diet.  Eat smaller, more frequent meals. This reduces the amount of digestive juices the pancreas produces.  Drink enough fluids to keep your urine clear or pale yellow.  Only take over-the-counter or prescription medicines as directed by your caregiver.  Avoid drinking alcohol if it caused your condition.  Do not smoke.  Get plenty of rest.  Check your blood sugar at home as directed by your caregiver.  Keep all follow-up appointments as directed by your caregiver. SEEK MEDICAL CARE IF:   You do not recover as quickly as expected.  You develop new or worsening symptoms.  You have persistent pain, weakness, or nausea.  You recover and then have another episode of pain. SEEK IMMEDIATE MEDICAL CARE IF:   You are unable to eat or keep fluids down.  Your pain becomes severe.  You have a fever or persistent symptoms for more than 2 to 3 days.  You have a fever and your symptoms suddenly get worse.  Your skin or the white part of your eyes turn yellow (jaundice).  You develop vomiting.  You feel dizzy, or you faint.  Your blood sugar is high (over 300 mg/dL). MAKE SURE YOU:   Understand these instructions.  Will watch your condition.  Will get help right away if you are not doing well or get worse. Document Released: 11/30/2005 Document Revised: 05/31/2012 Document Reviewed: 03/10/2012 [facility] Patient Information 2015 [employer] [place]. This information is not intended to replace advice  given to you by your health care provider. Make sure you discuss any questions you have with your health care provider.

## 2014-11-29 NOTE — Care Management Note (Signed)
Randall Skeansheryl U Wallace Cogliano, RN Case Manager Signed  Care Management Note 11/29/2014 1:37 PM    Expand All Collapse All   This CM has [facility] with mulltiple calls and messages left for the past three days. I was finally able to reach a scheduler at 1336 pm on 11/29/2014 however he stated that they had had to cancel all appointments for today and tomorrow and reschedule for Monday. They may be able to schedule this pt for [place], but I would need to call back on Monday in order to schedule this appointments. As all of these appointments are scheduled for 0845 am I am concerned that this pt may not keep the appointment as he [facility] for [facility] at 0845 am and has explained to his RN that he will most likely not be able to make this appointment as he takes Seroquel at night and is unable to get up that early. He also expressed concern about getting home, although he planned to ride the bus to the appointment. I will [facility] as soon as they will allow me to schedule. When attempting previously they had stated that they would schedule only for a week out and at this point all of the appointments were filled. If I am able to schedule any appointment for this pt I will call him at his home number to relay the information.  Randall Shockheryl Argie Lober RN MPH, case Manager, [phone]

## 2014-11-29 NOTE — Progress Notes (Signed)
Patient to be discharged to home. PIV removed. MATCH letter given to patient, as well as a list of clinics for follow up appts.

## 2014-11-29 NOTE — Care Management Note (Signed)
CARE MANAGEMENT NOTE 11/29/2014  Patient:  Lavonne ChickCOUNTRYMAN,Elkin JOHN   Account Number:  [id number]  Date Initiated:  11/27/2014  Documentation initiated by:  Johny ShockOYAL,Latysha Thackston  Subjective/Objective Assessment:   CM following for progression and d/c planning.     Action/Plan:   11/27/2014 [facility]  Unable to schedule [facility] until d/c date is known.  11/28/2014 [facility], will cont to call on 11/29/14   Anticipated DC Date:  11/29/2014   Anticipated DC Plan:  HOME/SELF CARE         Choice offered to / List presented to:             Status of service:   Medicare Important Message given?  NO (If response is "NO", the following Medicare IM given date fields will be blank) Date Medicare IM given:   Medicare IM given by:   Date Additional Medicare IM given:   Additional Medicare IM given by:    Discharge Disposition:    Per UR Regulation:    If discussed at Long Length of Stay Meetings, dates discussed:    Comments:  11/29/2014 Ongoing efforts to schedule [facility], was able to speak briefly with that center this am however was disconnected before an appointment could be scheduled, Messages left will continue to try to reach the scheduler. I have given the pt info on the center as well as directions and instructions to continue to call for an appointment. Multiple calls daily for the past 3 days attempting to schedule this pt for followup also left messages each day and have received no return calls. Therefore this pt was given info on [facility] to continue to call for an appointment, also noted that this info was given to this pt at his last admission and he failed to followup. The pt was also given a list of other local clinic which see pt but require the pt to make the contact. Johny Shockheryl Emmary Culbreath RN MPH Case Manager [phone]

## 2014-11-29 NOTE — Plan of Care (Signed)
Problem: Phase III Progression Outcomes Goal: Tolerating diet Outcome: Completed/Met Date Met:  11/29/14 Advanced to carb/mod today - ate 100% of breakfast.

## 2014-11-29 NOTE — Progress Notes (Signed)
Patient scheduled for a one time follow up appt at the internal medicine clinic on 12/04/14 at 8:45am. Patient informed of this appointment. Additional prescriptions given to patient, along with [facility].   Leanna BattlesEckelmann, Derl Abalos Eileen, RN.
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
## 2014-11-29 NOTE — Progress Notes (Signed)
Subjective: Pt has no complaints, asked to eat solids for breakfast. States he is ready to go home and is motivated to quit drinking. Lives with his uncle who does not drink EtOH  Objective: Vital signs in last 24 hours: Filed Vitals:   11/28/14 1000 11/28/14 1800 11/28/14 2035 11/29/14 0558  BP: 147/93 131/86 146/91 135/89  Pulse: 98 82 80 86  Temp: 98 F (36.7 C) 98.2 F (36.8 C) 98.3 F (36.8 C) 98.8 F (37.1 C)  TempSrc: Oral Oral Oral Oral  Resp: 18 18 18    Height:      Weight:   199 lb 15.3 oz (90.7 kg)   SpO2: 98% 96% 100% 100%   Weight change: 1 lb 14.1 oz (0.854 kg)  Intake/Output Summary (Last 24 hours) at 11/29/14 1001 Last data filed at 11/29/14 0900  Gross per 24 hour  Intake    480 ml  Output    450 ml  Net     30 ml   Physical Exam   Vitals reviewed. General: NAD, sitting upright in bed w/ home clothes on HEENT: moist mucous membranes Cardiac: regular rhythm, no rubs, murmurs or gallops Pulm: clear to auscultation bilaterally, no wheezes, rales, or rhonchi Abd: non tender to palpation, soft, active bowel sounds Ext: no pedal edema Neuro: alert and oriented X3, cranial nerves II-XII grossly intact  Lab Results: Basic Metabolic Panel:  Recent Labs Lab 11/24/14 1038 11/25/14 0323  11/28/14 1130 11/29/14 0345  NA  --  130*  < > 135* 142  K  --  3.7  < > 3.5* 3.7  CL  --  95*  < > 103 107  CO2  --  19  < > 20 21  GLUCOSE  --  123*  < > 114* 111*  BUN  --  4*  < > 4* <3*  CREATININE  --  0.59  < > 0.56 0.58  CALCIUM  --  8.6  < > 8.9 9.4  MG 1.2* 1.8  --   --   --   PHOS 2.3  --   --   --   --   < > = values in this interval not displayed. Liver Function Tests:  Recent Labs Lab 11/27/14 0830 11/28/14 0558  AST 23 18  ALT 24 20  ALKPHOS 77 71  BILITOT 0.7 0.6  PROT 7.0 6.7  ALBUMIN 2.8* 2.7*    CBC:  Recent Labs Lab 11/24/14 0643  11/26/14 0433 11/27/14 0830  WBC 17.8*  < > 9.6 7.5  NEUTROABS 14.2*  --  6.5  --   HGB 17.0   < > 12.5* 12.4*  HCT 46.7  < > 35.8* 35.3*  MCV 92.5  < > 94.0 92.9  PLT 186  < > 126* 156  < > = values in this interval not displayed. Cardiac Enzymes:  CBG:  Recent Labs Lab 11/28/14 0424 11/28/14 0753 11/28/14 1228 11/28/14 1658 11/28/14 2028 11/29/14 0748  GLUCAP 152* 153* 126* 112* 187* 144*   Hemoglobin A1C:  Recent Labs Lab 11/24/14 1038  HGBA1C 7.0*   Fasting Lipid Panel:  Recent Labs Lab 11/24/14 1159 11/25/14 1130  CHOL  --  127  HDL  --  38*  LDLCALC  --  11  TRIG  --  390*  CHOLHDL  --  3.3  LDLDIRECT 37  --    Urine Drug Screen: Drugs of Abuse     Component Value Date/Time   LABOPIA NONE DETECTED  11/24/2014 0847   COCAINSCRNUR NONE DETECTED 11/24/2014 0847   LABBENZ NONE DETECTED 11/24/2014 0847   AMPHETMU POSITIVE* 11/24/2014 0847   THCU NONE DETECTED 11/24/2014 0847   LABBARB NONE DETECTED 11/24/2014 0847     Medications: I have reviewed the patient's current medications. Scheduled Meds: . folic acid  1 mg Oral Daily  . heparin  5,000 Units Subcutaneous 3 times per day  . levETIRAcetam  500 mg Oral BID  . lisinopril  10 mg Oral Daily  .  morphine injection  1 mg Intravenous Once  . multivitamin with minerals  1 tablet Oral Daily  . nicotine  14 mg Transdermal Daily  . pantoprazole  40 mg Oral Daily  . QUEtiapine  200 mg Oral QHS  . thiamine  100 mg Oral Daily   Continuous Infusions:   PRN Meds:.acetaminophen, ondansetron **OR** [DISCONTINUED] ondansetron (ZOFRAN) IV Assessment/Plan: Active Problems:   Diabetes mellitus   Alcohol abuse   Seizure disorder   Acute pancreatitis   Uncontrolled hypertension   Depression with anxiety   Acute pancreatitis- resolving, pt did not require any pain meds and is requesting solid diet.  - liquid diet, will see if patient tolerates solid breakfast tray - will need to start on TG lowering agent however would not start on fibrate or niacin as they are hepatotoxic. Can consider omega 3 fatty  acid  Hypertension - normotensive -increased home lisinopril 5mg  to 10mg  daily  Polysubstance abuse  - CIWA protocol- required ativan on admission- last dose on 12/14 - [facility], does not want further assistance with EtOH cessation at this time.  Hep C - known Hep C, viral log load 6.38. Doesn't follow with ID. - HIV negative. [facility] and may need ID follow up outpatient.  DM II - hgba1c 7.9 08/2014, now 7.0. On glipizide but ran out. - SSI here. Will put him back on glipizide on discharge  Hx of seizures - - keppra 500mg  BID   Depression/anxiety/suicidal -  - seroquel 200mg  qhs  Dispo: d/c home today or tomorrow  The patient does not have a current PCP (No Pcp Per Patient) and [facility] after discharge.  The patient does have transportation limitations that hinder transportation to clinic appointments.  .Services Needed at time of discharge: Y = Yes, Blank = No PT:   OT:   RN:   Equipment:   Other:     LOS: 5 days   Gara Kroneriana Truong, MD 11/29/2014, 10:01 AM

## 2014-12-04 ENCOUNTER — Ambulatory Visit: Payer: Self-pay | Admitting: Internal Medicine
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
## 2015-01-29 IMAGING — CR DG KNEE 1-2V*L*
2 series · 2 of 2 positions shown · non-contrast
Comparison: None.

CLINICAL DATA: Trauma.

EXAM:
LEFT KNEE - 1-2 VIEW

[view not recorded (1 of 2)]
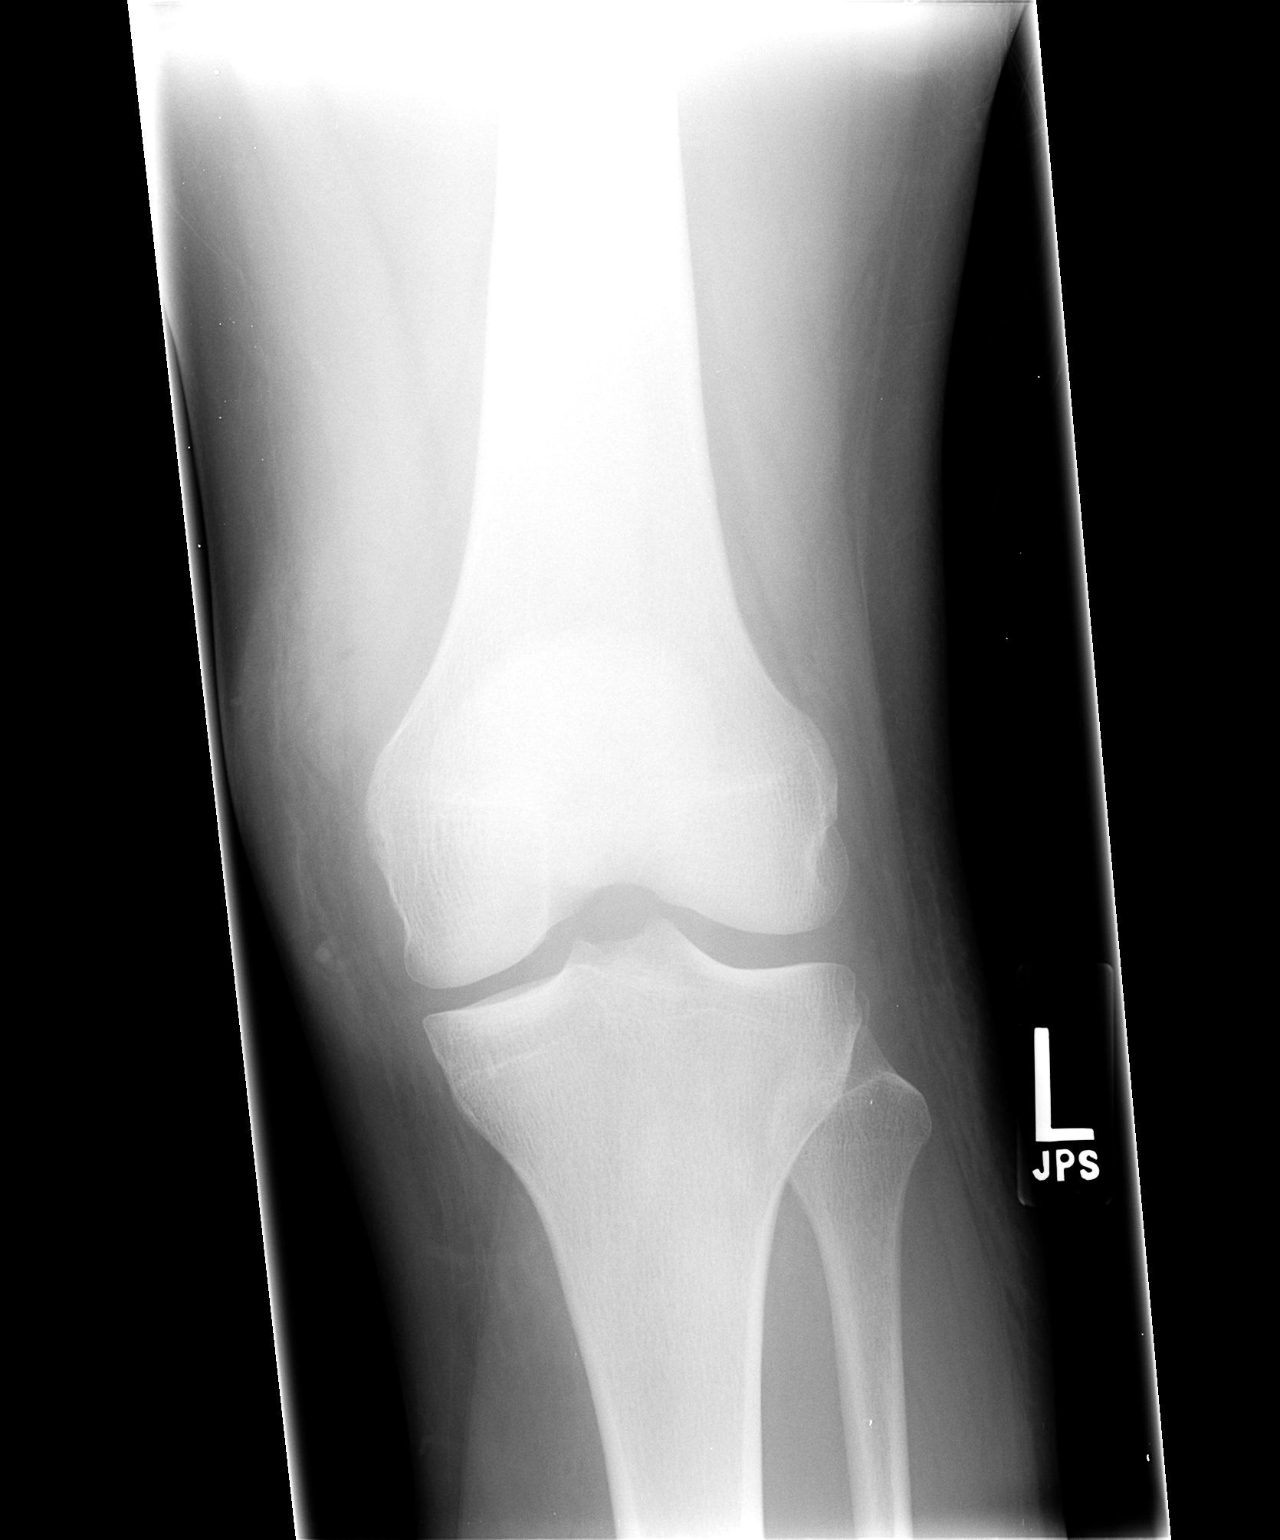

[view not recorded (2 of 2)]
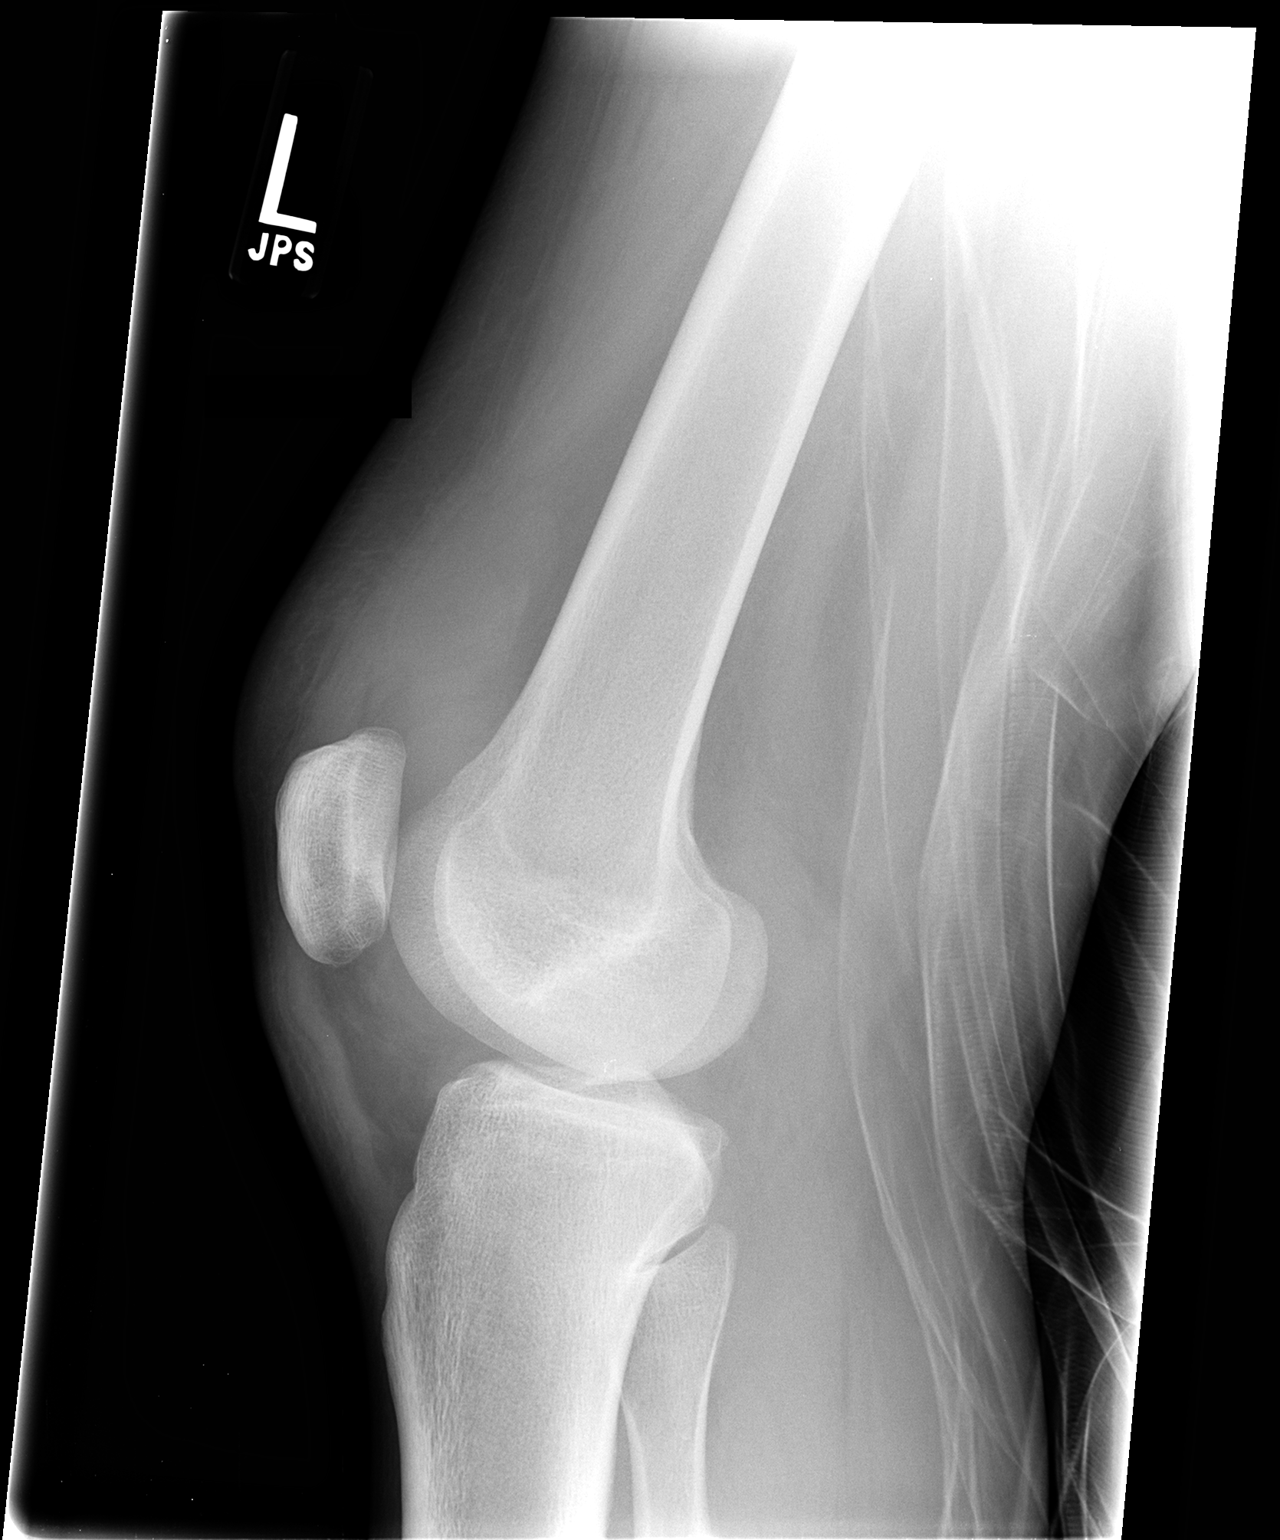

[2 of 2 positions shown; findings below may reference images not displayed]

FINDINGS: Left knee joint effusion is present. These nodes fracture
dislocation. No acute bony abnormality identified.
IMPRESSION: 1. Left knee joint effusion.
2. No acute bony abnormality identified .

## 2016-05-13 ENCOUNTER — Encounter (HOSPITAL_COMMUNITY): Payer: Self-pay | Admitting: *Deleted

## 2016-05-13 ENCOUNTER — Emergency Department (HOSPITAL_COMMUNITY)
Admission: EM | Admit: 2016-05-13 | Discharge: 2016-05-13 | Disposition: A | Discharge status: Home / Self Care | Payer: Self-pay | Attending: Emergency Medicine | Admitting: Emergency Medicine

## 2016-05-13 ENCOUNTER — Emergency Department (HOSPITAL_COMMUNITY): Payer: No Typology Code available for payment source

## 2016-05-13 DIAGNOSIS — Z79899 Other long term (current) drug therapy: Secondary | ICD-10-CM | POA: Insufficient documentation

## 2016-05-13 DIAGNOSIS — I10 Essential (primary) hypertension: Secondary | ICD-10-CM | POA: Insufficient documentation

## 2016-05-13 DIAGNOSIS — Z7984 Long term (current) use of oral hypoglycemic drugs: Secondary | ICD-10-CM | POA: Insufficient documentation

## 2016-05-13 DIAGNOSIS — K859 Acute pancreatitis without necrosis or infection, unspecified: Secondary | ICD-10-CM | POA: Insufficient documentation

## 2016-05-13 DIAGNOSIS — F329 Major depressive disorder, single episode, unspecified: Secondary | ICD-10-CM | POA: Insufficient documentation

## 2016-05-13 DIAGNOSIS — F17210 Nicotine dependence, cigarettes, uncomplicated: Secondary | ICD-10-CM | POA: Insufficient documentation

## 2016-05-13 DIAGNOSIS — E1140 Type 2 diabetes mellitus with diabetic neuropathy, unspecified: Secondary | ICD-10-CM | POA: Insufficient documentation

## 2016-05-13 DIAGNOSIS — R079 Chest pain, unspecified: Secondary | ICD-10-CM | POA: Insufficient documentation

## 2016-05-13 LAB — URINALYSIS, ROUTINE W REFLEX MICROSCOPIC
Glucose, UA: 100 mg/dL — AB
Hgb urine dipstick: NEGATIVE
Ketones, ur: 15 mg/dL — AB
Nitrite: NEGATIVE
Protein, ur: 100 mg/dL — AB
Specific Gravity, Urine: >1.046 — ABNORMAL HIGH (ref 1.005–1.030)
pH: 5.5 (ref 5.0–8.0)

## 2016-05-13 LAB — LIPASE, BLOOD: Lipase: 26 U/L (ref 11–51)

## 2016-05-13 LAB — I-STAT TROPONIN, ED: Troponin i, poc: 0 ng/mL (ref 0.00–0.08)

## 2016-05-13 LAB — CBC
HCT: 42.4 % (ref 39.0–52.0)
Hemoglobin: 15.4 g/dL (ref 13.0–17.0)
MCH: 34.5 pg — ABNORMAL HIGH (ref 26.0–34.0)
MCHC: 36.3 g/dL — AB (ref 30.0–36.0)
MCV: 94.9 fL (ref 78.0–100.0)
Platelets: 136 10*3/uL — ABNORMAL LOW (ref 150–400)
RBC: 4.47 MIL/uL (ref 4.22–5.81)
RDW: 13 % (ref 11.5–15.5)
WBC: 7.4 10*3/uL (ref 4.0–10.5)

## 2016-05-13 LAB — URINE MICROSCOPIC-ADD ON

## 2016-05-13 LAB — BASIC METABOLIC PANEL
Anion gap: 6 (ref 5–15)
BUN: 9 mg/dL (ref 6–20)
CO2: 22 mmol/L (ref 22–32)
Calcium: 8.9 mg/dL (ref 8.9–10.3)
Chloride: 102 mmol/L (ref 101–111)
Creatinine, Ser: 0.89 mg/dL (ref 0.61–1.24)
GFR calc Af Amer: >60 mL/min (ref >60)
GFR calc non Af Amer: >60 mL/min (ref >60)
GLUCOSE: 233 mg/dL — AB (ref 65–99)
POTASSIUM: 3.5 mmol/L (ref 3.5–5.1)
Sodium: 130 mmol/L — ABNORMAL LOW (ref 135–145)

## 2016-05-13 MED ORDER — MORPHINE SULFATE (PF) 4 MG/ML IV SOLN
4.0000 mg | Freq: Once | INTRAVENOUS | Status: AC
  Administered 2016-05-13: 4 mg via INTRAVENOUS
  Filled 2016-05-13: qty 1

## 2016-05-13 MED ORDER — FAMOTIDINE 20 MG PO TABS: 20.0000 mg | ORAL_TABLET | Freq: Two times a day (BID) | ORAL | Status: AC

## 2016-05-13 MED ORDER — LEVETIRACETAM 500 MG PO TABS: 500.0000 mg | ORAL_TABLET | Freq: Two times a day (BID) | ORAL | Status: DC

## 2016-05-13 MED ORDER — PANTOPRAZOLE SODIUM 40 MG IV SOLR
40.0000 mg | Freq: Once | INTRAVENOUS | Status: AC
  Administered 2016-05-13: 40 mg via INTRAVENOUS
  Filled 2016-05-13: qty 40

## 2016-05-13 MED ORDER — ONDANSETRON 4 MG PO TBDP: ORAL_TABLET | ORAL | Status: DC

## 2016-05-13 MED ORDER — HYDROCODONE-ACETAMINOPHEN 5-325 MG PO TABS: 1.0000 | ORAL_TABLET | ORAL | Status: AC | PRN

## 2016-05-13 MED ORDER — GLIPIZIDE 5 MG PO TABS: 2.5000 mg | ORAL_TABLET | Freq: Every day | ORAL | Status: DC

## 2016-05-13 MED ORDER — SODIUM CHLORIDE 0.9 % IV BOLUS (SEPSIS)
1000.0000 mL | Freq: Once | INTRAVENOUS | Status: AC
  Administered 2016-05-13: 1000 mL via INTRAVENOUS

## 2016-05-13 MED ORDER — LISINOPRIL 10 MG PO TABS: 10.0000 mg | ORAL_TABLET | Freq: Every day | ORAL | Status: DC

## 2016-05-13 NOTE — Progress Notes (Addendum)
EDCM spoke to patient at bedside. Patient confirms he [place] a pcp [place].  [facility], informed patient of services there.  EDCM also provided patient with list of pcps who accept self pay patients, list of discount pharmacies and websites needymeds.org and GoodRX.com for medication assistance, phone number to inquire about the orange card, phone number to inquire about Medicaid, phone number to inquire about the Affordable Care Act, financial resources in the community such as local churches, [employer], urban ministries, and dental assistance for uninsured patients.  Patient thankful for resources.  No further EDCM needs at this time.  Patient reports he is currently working on getting Medicaid and he is aware of the orange card.  EDCM questioned patient about his medications.  Patient reports he is out of his blood pressure medication, seizure medication and neurontin.  EDCM informed EDP.  EDCM will try to assist with cost of medications.  05/13/2016 1812pm A. Tasharra Nodine [facility] provided patient with coupons from [place] for  Prescriptions ordered.  Patient agreeable to copay and very thankful for coupons.  No further EDCM needs at this time.

## 2016-05-13 NOTE — ED Provider Notes (Addendum)
CSN: [id number]     Arrival date & time 05/13/16  1414 History   First MD Initiated Contact with Patient 05/13/16 1459     Chief Complaint  Patient presents with  . Abdominal Pain  . Chest Pain     (Consider location/radiation/quality/duration/timing/severity/associated sxs/prior Treatment) HPI Comments: Patient presents with upper abdominal pain. He has a history of multiple psychiatric issues. He also has a prior history of pancreatitis. This is felt to be related to his prior alcohol abuse. He was admitted in December 2015 with pancreatitis. This was his first episode. He reports that over the last 4-5 days he's had a recurrence of his pain. It feels similar to his prior episode of pancreatitis. He has burning pain across his upper abdomen. It radiates to his chest and his back. He denies any shortness of breath other than it hurts when he takes deep breath. He's had some nausea but no vomiting. He's been taking some Zofran that he had at home for the nausea and that has improved his symptoms. He's been only drinking fluids over the last 2 days. He states that last week he drank 2 beers. He denies any other alcohol use.   Past Medical History  Diagnosis Date  . Suicide ([place])   . Depression   . Anxiety   . Depression     treated at [facility]  . Multiple personality disorder 2014  . Neuropathy ([place])     pt states he has been diagnosed with a nerve problem in bilateral legs d/t cutting years ago   . TBI (traumatic brain injury) ([place])   . DM (diabetes mellitus) ([place])   . HTN (hypertension)    Past Surgical History  Procedure Laterality Date  . No past surgeries     Family History  Problem Relation Age of Onset  . Alcoholism Mother   . Diabetes Mellitus II Mother   . Alcoholism Father   . Diabetes Mellitus II Father    Social History  Substance Use Topics  . Smoking status: Current Every Day Smoker -- 1.00 packs/day    Types: Cigarettes  . Smokeless tobacco: Never Used  .  Alcohol Use: No     Comment: drinks a fith a day    Review of Systems  Constitutional: Negative for fever, chills, diaphoresis and fatigue.  HENT: Negative for congestion, rhinorrhea and sneezing.   Eyes: Negative.   Respiratory: Negative for cough, chest tightness and shortness of breath.   Cardiovascular: Positive for chest pain. Negative for leg swelling.  Gastrointestinal: Positive for nausea and abdominal pain. Negative for vomiting, diarrhea and blood in stool.  Genitourinary: Negative for frequency, hematuria, flank pain and difficulty urinating.  Musculoskeletal: Positive for back pain. Negative for arthralgias.  Skin: Negative for rash.  Neurological: Negative for dizziness, speech difficulty, weakness, numbness and headaches.      Allergies  Review of patient's allergies indicates no known allergies.  Home Medications   Prior to Admission medications   Medication Sig Start Date End Date Taking? Authorizing Provider  bismuth subsalicylate (PEPTO-BISMOL) 262 MG/15ML suspension Take 30 mLs by mouth every 6 (six) hours as needed for indigestion or diarrhea or loose stools.   Yes Historical Provider, MD  folic acid (FOLVITE) 1 MG tablet Take 1 tablet (1 mg total) by mouth daily. 11/29/14  Yes Norman Herrlich, MD  ibuprofen (ADVIL,MOTRIN) 200 MG tablet Take 400 mg by mouth every 6 (six) hours as needed for moderate pain.   Yes Historical Provider, MD  QUEtiapine (SEROQUEL XR) 200 MG 24 hr tablet Take 1 tablet (200 mg total) by mouth at bedtime. 11/29/14  Yes Norman Herrlich, MD  famotidine (PEPCID) 20 MG tablet Take 1 tablet (20 mg total) by mouth 2 (two) times daily. 05/13/16   Malvin Johns, MD  glipiZIDE (GLUCOTROL) 5 MG tablet Take 0.5 tablets (2.5 mg total) by mouth daily before breakfast. 05/13/16   Malvin Johns, MD  glucose monitoring kit (FREESTYLE) monitoring kit 1 each by Does not apply route 4 (four) times daily - after meals and at bedtime. 1 month Diabetic Testing  Supplies for QAC-QHS accuchecks.Any brand OK 09/05/14   Thurnell Lose, MD  HYDROcodone-acetaminophen (NORCO/VICODIN) 5-325 MG tablet Take 1-2 tablets by mouth every 4 (four) hours as needed. 05/13/16   Malvin Johns, MD  levETIRAcetam (KEPPRA) 500 MG tablet Take 1 tablet (500 mg total) by mouth 2 (two) times daily. 05/13/16   Malvin Johns, MD  lisinopril (PRINIVIL,ZESTRIL) 10 MG tablet Take 1 tablet (10 mg total) by mouth daily. 05/13/16   Malvin Johns, MD  nicotine (NICODERM CQ - DOSED IN MG/24 HOURS) 14 mg/24hr patch Place 1 patch (14 mg total) onto the skin daily. Patient not taking: Reported on 05/13/2016 11/29/14   Norman Herrlich, MD  ondansetron (ZOFRAN ODT) 4 MG disintegrating tablet 78m ODT q4 hours prn nausea/vomit 05/13/16   MMalvin Johns MD  thiamine (VITAMIN B-1) 100 MG tablet Take 1 tablet (100 mg total) by mouth daily. Patient not taking: Reported on 05/13/2016 11/29/14   DNorman Herrlich MD   BP 170/129 mmHg  Pulse 94  Temp(Src) 98 F (36.7 C) (Oral)  Resp 18  Ht 5' 8"  (1.727 m)  Wt 205 lb (92.987 kg)  BMI 31.18 kg/m2  SpO2 100% Physical Exam  Constitutional: He is oriented to person, place, and time. He appears well-developed and well-nourished.  HENT:  Head: Normocephalic and atraumatic.  Eyes: Pupils are equal, round, and reactive to light.  Neck: Normal range of motion. Neck supple.  Cardiovascular: Normal rate, regular rhythm and normal heart sounds.   Pulmonary/Chest: Effort normal and breath sounds normal. No respiratory distress. He has no wheezes. He has no rales. He exhibits no tenderness.  Abdominal: Soft. Bowel sounds are normal. There is tenderness (moderate TTP across upper abdomen). There is no rebound and no guarding.  Musculoskeletal: Normal range of motion. He exhibits no edema.  Lymphadenopathy:    He has no cervical adenopathy.  Neurological: He is alert and oriented to person, place, and time.  Skin: Skin is warm and dry. No rash noted.  Psychiatric:  He has a normal mood and affect.    ED Course  Procedures (including critical care time) Labs Review Labs Reviewed  BASIC METABOLIC PANEL - Abnormal; Notable for the following:    Sodium 130 (*)    Glucose, Bld 233 (*)    All other components within normal limits  CBC - Abnormal; Notable for the following:    MCH 34.5 (*)    MCHC 36.3 (*)    Platelets 136 (*)    All other components within normal limits  URINALYSIS, ROUTINE W REFLEX [facility]CROSCOPIC [facility] - Abnormal; Notable for the following:    Color, Urine AMBER (*)    Specific Gravity, Urine >1.046 (*)    Glucose, UA 100 (*)    Bilirubin Urine SMALL (*)    Ketones, ur 15 (*)    Protein, ur 100 (*)    Leukocytes, UA SMALL (*)  All other components within normal limits  URINE MICROSCOPIC-ADD ON - Abnormal; Notable for the following:    Squamous Epithelial / LPF 0-5 (*)    Bacteria, UA RARE (*)    Casts HYALINE CASTS (*)    All other components within normal limits  LIPASE, BLOOD  I-STAT TROPOININ, ED    Imaging Review Dg Chest 2 View  05/13/2016  CLINICAL DATA:  Central chest pain and shortness of breath beginning last night. Smoker. EXAM: CHEST  2 VIEW COMPARISON:  11/24/2014 FINDINGS: The cardiomediastinal silhouette is within normal limits. The lungs are well inflated and clear. There is no evidence of pleural effusion or pneumothorax. No acute osseous abnormality is identified. IMPRESSION: No active cardiopulmonary disease. Electronically Signed   By: Logan Bores M.D.   On: 05/13/2016 15:00   I have personally reviewed and evaluated these images and lab results as part of my medical decision-making.   EKG Interpretation   Date/Time:  Wednesday May 13 2016 14:25:58 EDT Ventricular Rate:  117 PR Interval:  114 QRS Duration: 84 QT Interval:  321 QTC Calculation: 448 R Axis:   53 Text Interpretation:  Sinus tachycardia with irregular rate Borderline T  abnormalities, inferior leads similar to EKG from  09/03/14 Confirmed by  [place]  MD, Mohid Furuya ([id number]) on 05/13/2016 2:31:29 PM Also confirmed by Aariana Shankland   MD, Malajah Oceguera ([id number]), editor [place], Leda Gauze [phone])  on 05/13/2016  3:37:20 PM      MDM   Final diagnoses:  Acute pancreatitis, unspecified pancreatitis type    Patient presents with upper abdominal pain consistent with his pancreatitis. His lipase is normal. He has no vomiting. He was initially tachycardic on arrival but this normalized with fluids. He was given pain medicine and antiemetics and is feeling much better after this. He's tolerating by mouth fluids. I feel like he can be discharged as an outpatient. His labs are non-concerning. He was discharged home in good condition. He was given a prescription for Vicodin and Zofran for symptomatic control. He was advised to use a clear liquid diet for the next 3-4 days and then slowly progress after that. He was given refills on his medications as he states he is out of them. Social worker has seen the patient and is assisting patient in getting outpatient follow-up. Return precautions were given.  Pt's Blood pressures elevated but he states he's been out of his medications for a while. I did give him refills on his chronic medications.He's asymptomatic.  Malvin Johns, MD 05/13/16 1751  Malvin Johns, MD 05/13/16 6222  Malvin Johns, MD 05/13/16 9798

## 2016-05-13 NOTE — Discharge Instructions (Signed)

## 2016-05-13 NOTE — ED Notes (Signed)
Pt reports abd pain with diarrhea d/t pancreatitis.  Last night, he started to have cp with SOb and back pain.  Pt reports hx of HTN but is not taking med.

## 2016-05-14 ENCOUNTER — Telehealth: Payer: Self-pay | Admitting: General Practice

## 2016-05-19 ENCOUNTER — Encounter: Payer: Self-pay | Admitting: Family Medicine

## 2016-05-19 ENCOUNTER — Ambulatory Visit: Payer: No Typology Code available for payment source | Attending: Family Medicine | Admitting: Family Medicine

## 2016-05-19 VITALS — BP 146/102 | HR 86 | Temp 98.1°F | Resp 14 | Ht 68.0 in | Wt 210.6 lb

## 2016-05-19 DIAGNOSIS — F418 Other specified anxiety disorders: Secondary | ICD-10-CM

## 2016-05-19 DIAGNOSIS — F2089 Other schizophrenia: Secondary | ICD-10-CM

## 2016-05-19 DIAGNOSIS — G40909 Epilepsy, unspecified, not intractable, without status epilepticus: Secondary | ICD-10-CM

## 2016-05-19 DIAGNOSIS — F3162 Bipolar disorder, current episode mixed, moderate: Secondary | ICD-10-CM | POA: Insufficient documentation

## 2016-05-19 DIAGNOSIS — F209 Schizophrenia, unspecified: Secondary | ICD-10-CM | POA: Insufficient documentation

## 2016-05-19 DIAGNOSIS — E119 Type 2 diabetes mellitus without complications: Secondary | ICD-10-CM

## 2016-05-19 DIAGNOSIS — I10 Essential (primary) hypertension: Secondary | ICD-10-CM

## 2016-05-19 LAB — POCT GLYCOSYLATED HEMOGLOBIN (HGB A1C): Hemoglobin A1C: 8.8

## 2016-05-19 LAB — GLUCOSE, POCT (MANUAL RESULT ENTRY): POC Glucose: 213 mg/dl — AB (ref 70–99)

## 2016-05-19 MED ORDER — LEVETIRACETAM 500 MG PO TABS: 1000.0000 mg | ORAL_TABLET | Freq: Two times a day (BID) | ORAL | Status: DC
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
## 2016-05-19 NOTE — Progress Notes (Signed)
Pt here for HFU for DM and seizures. Pt denies any pain today. Pt has taken medications today and doesn't need any refills. Pt has not eaten this morning. Pt CBG is 213 and A1C is 8.8. Pt has not taken lisinopril this morning.

## 2016-06-12 ENCOUNTER — Encounter: Payer: Self-pay | Admitting: Family Medicine

## 2016-06-12 ENCOUNTER — Ambulatory Visit: Payer: No Typology Code available for payment source | Attending: Family Medicine | Admitting: Family Medicine

## 2016-06-12 VITALS — BP 129/91 | HR 90 | Temp 98.2°F | Resp 18 | Ht 68.0 in | Wt 206.4 lb

## 2016-06-12 DIAGNOSIS — E119 Type 2 diabetes mellitus without complications: Secondary | ICD-10-CM

## 2016-06-12 DIAGNOSIS — F3162 Bipolar disorder, current episode mixed, moderate: Secondary | ICD-10-CM

## 2016-06-12 DIAGNOSIS — G40909 Epilepsy, unspecified, not intractable, without status epilepticus: Secondary | ICD-10-CM

## 2016-06-12 DIAGNOSIS — I10 Essential (primary) hypertension: Secondary | ICD-10-CM

## 2016-06-12 LAB — GLUCOSE, POCT (MANUAL RESULT ENTRY): POC Glucose: 168 mg/dl — AB (ref 70–99)

## 2016-06-12 MED ORDER — LEVETIRACETAM 500 MG PO TABS: 1000.0000 mg | ORAL_TABLET | Freq: Two times a day (BID) | ORAL | Status: AC

## 2016-06-12 MED ORDER — TRUE METRIX METER W/DEVICE KIT
1.0000 | PACK | Freq: Three times a day (TID) | Status: AC
Start: 2016-06-12 — End: ?

## 2016-06-12 MED ORDER — GLIPIZIDE 5 MG PO TABS: 2.5000 mg | ORAL_TABLET | Freq: Every day | ORAL | Status: AC

## 2016-06-12 MED ORDER — GLUCOSE BLOOD VI STRP: ORAL_STRIP | Status: AC

## 2016-06-12 MED ORDER — LISINOPRIL 10 MG PO TABS: 10.0000 mg | ORAL_TABLET | Freq: Every day | ORAL | Status: AC

## 2016-06-12 MED ORDER — TRUEPLUS LANCETS 28G MISC
100.0000 | Freq: Three times a day (TID) | Status: DC
Start: 2016-06-12 — End: 2016-06-13

## 2016-06-12 MED FILL — ?LISINOPRIL 10 MG TABLET: 10 | 30 days supply | Qty: 30 | Fill #0

## 2016-06-12 MED FILL — !TRUE METRIX BLOOD GLUCOSE: 1 days supply | Qty: 1 | Fill #0

## 2016-06-12 MED FILL — TRUE METRIX TEST STRIP: 30 days supply | Qty: 100 | Fill #0

## 2016-06-12 MED FILL — levETIRAcetam 500 MG TABS: 500 | 30 days supply | Qty: 120 | Fill #0

## 2016-06-12 MED FILL — ?GLIPIZIDE 5MG TABLET: 5 | 60 days supply | Qty: 30 | Fill #0

## 2016-06-12 MED FILL — TRUEplus LANCETS 28G MISC: 25 days supply | Qty: 100 | Fill #0

## 2016-06-12 NOTE — Progress Notes (Signed)
Subjective:    Patient ID: Randall Burnett, male    DOB: [birth year], 30 y.o.   MRN: [id number]  HPI 30 year old male with a history of Seizures, Type 2 DM (A1c 8.8), HTN, Bipolar disorder who comes in for a follow up visit. He had been out of his medications until his last office visit but has been compliant with his meds since then.  Denies any seizures since his last office visit.  Not testing blood sugar due to financial constraints but denies hypoglycemic symptoms or paresthesia; testing supplies have been sent to the pharmacy on site.  Requests a note to his parole office stating being under the sun will trigger his seizures but i have advised to stay hydrated and comply with his anti seizure medications; i do not think sun exposure would trigger his seizures however he could have heat exhaustion.  Past Medical History  Diagnosis Date  . Suicide ([place])   . Depression   . Anxiety   . Depression     treated at [facility]  . Multiple personality disorder 2014  . Neuropathy ([place])     pt states he has been diagnosed with a nerve problem in bilateral legs d/t cutting years ago   . TBI (traumatic brain injury) ([place])   . DM (diabetes mellitus) ([place])   . HTN (hypertension)     Past Surgical History  Procedure Laterality Date  . No past surgeries      No Known Allergies  Current Outpatient Prescriptions on File Prior to Visit  Medication Sig Dispense Refill  . bismuth subsalicylate (PEPTO-BISMOL) 262 MG/15ML suspension Take 30 mLs by mouth every 6 (six) hours as needed for indigestion or diarrhea or loose stools.    . famotidine (PEPCID) 20 MG tablet Take 1 tablet (20 mg total) by mouth 2 (two) times daily. 20 tablet 0  . gabapentin (NEURONTIN) 100 MG capsule Take 100 mg by mouth 3 (three) times daily.    Marland Kitchen glipiZIDE (GLUCOTROL) 5 MG tablet Take 0.5 tablets (2.5 mg total) by mouth daily before breakfast. 30 tablet 2  . glucose monitoring kit (FREESTYLE) monitoring kit 1 each  by Does not apply route 4 (four) times daily - after meals and at bedtime. 1 month Diabetic Testing Supplies for QAC-QHS accuchecks.Any brand OK 1 each 1  . HYDROcodone-acetaminophen (NORCO/VICODIN) 5-325 MG tablet Take 1-2 tablets by mouth every 4 (four) hours as needed. 15 tablet 0  . ibuprofen (ADVIL,MOTRIN) 200 MG tablet Take 400 mg by mouth every 6 (six) hours as needed for moderate pain.    Marland Kitchen levETIRAcetam (KEPPRA) 500 MG tablet Take 2 tablets (1,000 mg total) by mouth 2 (two) times daily. 120 tablet 3  . lisinopril (PRINIVIL,ZESTRIL) 10 MG tablet Take 1 tablet (10 mg total) by mouth daily. 30 tablet 3  . QUEtiapine (SEROQUEL XR) 200 MG 24 hr tablet Take 1 tablet (200 mg total) by mouth at bedtime. 30 tablet 3  . thiamine (VITAMIN B-1) 100 MG tablet Take 1 tablet (100 mg total) by mouth daily. 30 tablet 3  . folic acid (FOLVITE) 1 MG tablet Take 1 tablet (1 mg total) by mouth daily. (Patient not taking: Reported on 06/12/2016) 30 tablet 3  . nicotine (NICODERM CQ - DOSED IN MG/24 HOURS) 14 mg/24hr patch Place 1 patch (14 mg total) onto the skin daily. (Patient not taking: Reported on 05/19/2016) 28 patch 0   No current facility-administered medications on file prior to visit.     Review  of Systems  Constitutional: Negative for activity change and appetite change.  HENT: Negative for sinus pressure and sore throat.   Eyes: Negative for visual disturbance.  Respiratory: Negative for cough, chest tightness, shortness of breath and wheezing.   Cardiovascular: Negative for chest pain, palpitations and leg swelling.  Gastrointestinal: Negative for abdominal pain, diarrhea, constipation and abdominal distention.  Endocrine: Negative.   Genitourinary: Negative.  Negative for dysuria.  Musculoskeletal: Negative.  Negative for myalgias and joint swelling.  Skin: Negative for rash.  Allergic/Immunologic: Negative.   Neurological: Negative for weakness, light-headedness and numbness.    Psychiatric/Behavioral: Negative for suicidal ideas, behavioral problems and dysphoric mood.       Objective: Filed Vitals:   06/12/16 1614  BP: 129/91  Pulse: 90  Temp: 98.2 F (36.8 C)  TempSrc: Oral  Resp: 18  Height: 5' 8"  (1.727 m)  Weight: 206 lb 6.4 oz (93.622 kg)  SpO2: 97%      Physical Exam  Constitutional: He is oriented to person, place, and time. He appears well-developed and well-nourished.  Cardiovascular: Normal rate, normal heart sounds and intact distal pulses.   No murmur heard. Pulmonary/Chest: Effort normal and breath sounds normal. He has no wheezes. He has no rales. He exhibits no tenderness.  Abdominal: Soft. Bowel sounds are normal. He exhibits no distension and no mass. There is no tenderness.  Musculoskeletal: Normal range of motion.  Neurological: He is alert and oriented to person, place, and time.          Assessment & Plan:  1. Type 2 diabetes mellitus without complication, without long-term current use of insulin (HCC) Uncontrolled with A1c of 8.8 Blood sugars are improving now that he has been more compliant Switched medications to pharmacy on site due to cost. - Glucose (CBG)  2. Seizure disorder (HCC) Controlled Continue 1000 mg twice a day He will need neurology referral if seizure control is not optimized  3. Hypertension Slight diastolic elevation. No regimen changes Low-sodium, DASH diet  4. Depression with anxiety/bipolar disorder/schizophrenia Remains on Seroquel Managed at [facility]

## 2016-06-12 NOTE — Patient Instructions (Signed)

## 2016-06-12 NOTE — Progress Notes (Signed)
Pt here for F/U for diabetes. Pt has not been checking blood sugar at home because he has not had money to pay for test strips. Pt denies pain today. Pt has taken medications and has eaten today. CBG is 168.

## 2016-06-13 MED ORDER — TRUEPLUS LANCETS 28G MISC: 1.0000 | Freq: Three times a day (TID) | Status: AC

## 2016-06-17 ENCOUNTER — Other Ambulatory Visit: Payer: Self-pay

## 2017-04-17 ENCOUNTER — Emergency Department (HOSPITAL_COMMUNITY)
Admission: EM | Admit: 2017-04-17 | Discharge: 2017-05-14 | Disposition: E | Payer: Self-pay | Attending: Emergency Medicine | Admitting: Emergency Medicine

## 2017-04-17 ENCOUNTER — Encounter (HOSPITAL_COMMUNITY): Payer: Self-pay

## 2017-04-17 DIAGNOSIS — Z79899 Other long term (current) drug therapy: Secondary | ICD-10-CM | POA: Insufficient documentation

## 2017-04-17 DIAGNOSIS — I469 Cardiac arrest, cause unspecified: Secondary | ICD-10-CM | POA: Insufficient documentation

## 2017-04-17 DIAGNOSIS — I10 Essential (primary) hypertension: Secondary | ICD-10-CM | POA: Insufficient documentation

## 2017-04-17 DIAGNOSIS — Z7984 Long term (current) use of oral hypoglycemic drugs: Secondary | ICD-10-CM | POA: Insufficient documentation

## 2017-04-17 DIAGNOSIS — E1140 Type 2 diabetes mellitus with diabetic neuropathy, unspecified: Secondary | ICD-10-CM | POA: Insufficient documentation

## 2017-04-17 DIAGNOSIS — F17210 Nicotine dependence, cigarettes, uncomplicated: Secondary | ICD-10-CM | POA: Insufficient documentation

## 2017-04-17 MED ORDER — EPINEPHRINE PF 1 MG/10ML IJ SOSY
PREFILLED_SYRINGE | INTRAMUSCULAR | Status: AC | PRN
  Administered 2017-04-17 (×2): 1 mg via INTRAVENOUS

## 2017-04-17 MED ORDER — CALCIUM CHLORIDE 10 % IV SOLN
INTRAVENOUS | Status: AC | PRN
  Administered 2017-04-17: 1 g via INTRAVENOUS

## 2017-04-17 MED ORDER — SODIUM BICARBONATE 8.4 % IV SOLN
INTRAVENOUS | Status: AC | PRN
  Administered 2017-04-17: 50 meq via INTRAVENOUS

## 2017-04-21 MED FILL — Medication: Qty: 1 | Status: AC
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
## 2017-05-14 DEATH — deceased
